# Patient Record
Sex: Female | Born: 1965 | Race: White | Hispanic: No | Marital: Married | Smoking: Never smoker
Health system: Southern US, Community
[De-identification: ages and names within clinical notes are randomized; demographics above are authoritative.]

## PROBLEM LIST (undated history)

## (undated) DIAGNOSIS — IMO0001 Reserved for inherently not codable concepts without codable children: Secondary | ICD-10-CM

## (undated) DIAGNOSIS — J189 Pneumonia, unspecified organism: Secondary | ICD-10-CM

## (undated) DIAGNOSIS — Z9889 Other specified postprocedural states: Secondary | ICD-10-CM

## (undated) DIAGNOSIS — E785 Hyperlipidemia, unspecified: Secondary | ICD-10-CM

## (undated) DIAGNOSIS — C801 Malignant (primary) neoplasm, unspecified: Secondary | ICD-10-CM

## (undated) DIAGNOSIS — M1990 Unspecified osteoarthritis, unspecified site: Secondary | ICD-10-CM

## (undated) DIAGNOSIS — F419 Anxiety disorder, unspecified: Secondary | ICD-10-CM

## (undated) DIAGNOSIS — E1169 Type 2 diabetes mellitus with other specified complication: Secondary | ICD-10-CM

## (undated) DIAGNOSIS — R7303 Prediabetes: Secondary | ICD-10-CM

## (undated) DIAGNOSIS — I10 Essential (primary) hypertension: Secondary | ICD-10-CM

## (undated) DIAGNOSIS — E039 Hypothyroidism, unspecified: Secondary | ICD-10-CM

## (undated) DIAGNOSIS — E669 Obesity, unspecified: Secondary | ICD-10-CM

## (undated) DIAGNOSIS — R112 Nausea with vomiting, unspecified: Secondary | ICD-10-CM

## (undated) HISTORY — PX: WISDOM TOOTH EXTRACTION: SHX21

## (undated) HISTORY — DX: Reserved for inherently not codable concepts without codable children: IMO0001

## (undated) HISTORY — PX: ADENOIDECTOMY: SUR15

## (undated) HISTORY — DX: Malignant (primary) neoplasm, unspecified: C80.1

## (undated) HISTORY — DX: Essential (primary) hypertension: I10

## (undated) HISTORY — DX: Hypothyroidism, unspecified: E03.9

## (undated) HISTORY — DX: Type 2 diabetes mellitus with other specified complication: E11.69

## (undated) HISTORY — DX: Obesity, unspecified: E66.9

## (undated) HISTORY — PX: COLONOSCOPY: SHX174

---

## 1988-04-15 DIAGNOSIS — F4310 Post-traumatic stress disorder, unspecified: Secondary | ICD-10-CM

## 1988-04-15 HISTORY — DX: Post-traumatic stress disorder, unspecified: F43.10

## 1997-07-12 ENCOUNTER — Ambulatory Visit (HOSPITAL_COMMUNITY): Admission: RE | Admit: 1997-07-12 | Discharge: 1997-07-12 | Payer: Self-pay | Admitting: *Deleted

## 1998-09-14 ENCOUNTER — Other Ambulatory Visit: Admission: RE | Admit: 1998-09-14 | Discharge: 1998-09-14 | Payer: Self-pay | Admitting: *Deleted

## 1999-01-24 ENCOUNTER — Encounter (INDEPENDENT_AMBULATORY_CARE_PROVIDER_SITE_OTHER): Payer: Self-pay | Admitting: Specialist

## 1999-01-24 ENCOUNTER — Inpatient Hospital Stay (HOSPITAL_COMMUNITY): Admission: RE | Admit: 1999-01-24 | Discharge: 1999-01-26 | Payer: Self-pay | Admitting: Obstetrics & Gynecology

## 1999-04-16 HISTORY — PX: LAPAROSCOPIC VAGINAL HYSTERECTOMY WITH SALPINGO OOPHORECTOMY: SHX6681

## 1999-11-21 ENCOUNTER — Other Ambulatory Visit: Admission: RE | Admit: 1999-11-21 | Discharge: 1999-11-21 | Payer: Self-pay | Admitting: Obstetrics & Gynecology

## 2000-08-06 ENCOUNTER — Encounter: Admission: RE | Admit: 2000-08-06 | Discharge: 2000-08-22 | Payer: Self-pay | Admitting: Family Medicine

## 2000-12-30 ENCOUNTER — Encounter: Admission: RE | Admit: 2000-12-30 | Discharge: 2001-01-06 | Payer: Self-pay | Admitting: Family Medicine

## 2001-04-23 ENCOUNTER — Other Ambulatory Visit: Admission: RE | Admit: 2001-04-23 | Discharge: 2001-04-23 | Payer: Self-pay | Admitting: Obstetrics & Gynecology

## 2004-06-11 ENCOUNTER — Encounter: Admission: RE | Admit: 2004-06-11 | Discharge: 2004-06-11 | Payer: Self-pay | Admitting: Family Medicine

## 2004-09-05 DIAGNOSIS — D229 Melanocytic nevi, unspecified: Secondary | ICD-10-CM

## 2004-09-05 DIAGNOSIS — C439 Malignant melanoma of skin, unspecified: Secondary | ICD-10-CM

## 2004-09-05 HISTORY — DX: Melanocytic nevi, unspecified: D22.9

## 2004-09-05 HISTORY — DX: Malignant melanoma of skin, unspecified: C43.9

## 2004-09-26 ENCOUNTER — Encounter: Admission: RE | Admit: 2004-09-26 | Discharge: 2004-09-26 | Payer: Self-pay | Admitting: Family Medicine

## 2004-10-04 ENCOUNTER — Encounter: Admission: RE | Admit: 2004-10-04 | Discharge: 2004-10-04 | Payer: Self-pay | Admitting: Family Medicine

## 2007-07-09 ENCOUNTER — Encounter: Admission: RE | Admit: 2007-07-09 | Discharge: 2007-07-09 | Payer: Self-pay | Admitting: Family Medicine

## 2008-12-12 ENCOUNTER — Ambulatory Visit (HOSPITAL_COMMUNITY): Admission: RE | Admit: 2008-12-12 | Discharge: 2008-12-12 | Payer: Self-pay | Admitting: Urology

## 2015-08-08 ENCOUNTER — Ambulatory Visit: Payer: BC Managed Care – PPO | Admitting: Sports Medicine

## 2015-08-28 ENCOUNTER — Ambulatory Visit: Payer: BC Managed Care – PPO | Admitting: Sports Medicine

## 2016-01-14 DIAGNOSIS — E1169 Type 2 diabetes mellitus with other specified complication: Secondary | ICD-10-CM

## 2016-01-14 HISTORY — DX: Type 2 diabetes mellitus with other specified complication: E11.69

## 2016-02-16 ENCOUNTER — Other Ambulatory Visit: Payer: Self-pay | Admitting: Family Medicine

## 2016-02-16 DIAGNOSIS — R7989 Other specified abnormal findings of blood chemistry: Secondary | ICD-10-CM

## 2016-02-16 DIAGNOSIS — R945 Abnormal results of liver function studies: Principal | ICD-10-CM

## 2016-02-23 ENCOUNTER — Encounter: Payer: Self-pay | Admitting: Podiatry

## 2016-02-23 ENCOUNTER — Ambulatory Visit (INDEPENDENT_AMBULATORY_CARE_PROVIDER_SITE_OTHER): Payer: BC Managed Care – PPO | Admitting: Podiatry

## 2016-02-23 VITALS — BP 151/90 | HR 93 | Resp 16 | Ht 63.0 in | Wt 307.0 lb

## 2016-02-23 DIAGNOSIS — L600 Ingrowing nail: Secondary | ICD-10-CM

## 2016-02-23 NOTE — Patient Instructions (Signed)

## 2016-02-23 NOTE — Progress Notes (Signed)
   Subjective:    Patient ID: Megan Brooks, female    DOB: 1966-02-12, 50 y.o.   MRN: [id number]  HPI Chief Complaint  Patient presents with  . Nail Problem    Left foot; great toe; pt stated, "Went to [facility] on Monday, February 19, 2016; had infection and they drained it; on Doxycycline"      Review of Systems  All other systems reviewed and are negative.      Objective:   Physical Exam        Assessment & Plan:

## 2016-02-24 NOTE — Progress Notes (Signed)
Subjective:     Patient ID: Megan Brooks, female   DOB: [birth year], 50 y.o.   MRN: [id number]  HPI patient states I've had chronic problems with my big toenail on my left foot and it became infected over the last few weeks and I been on doxycycline and it seems better but the nail is still very sore and I know I need to have it removed   Review of Systems  All other systems reviewed and are negative.      Objective:   Physical Exam  Constitutional: She is oriented to person, place, and time.  Cardiovascular: Intact distal pulses.   Musculoskeletal: Normal range of motion.  Neurological: She is oriented to person, place, and time.  Skin: Skin is warm.  Nursing note and vitals reviewed.  neurovascular status intact muscle strength adequate range of motion within normal limits with patient noted to have inflammation and pain around the left hallux nail mostly on the lateral side with localized drainage and no proximal edema erythema noted. It is painful when pressed and there is deformity of the nailbed and patient's noted to have good digital perfusion and is well oriented 3     Assessment:     Damaged left hallux nail with acute inflammatory and mild infective process noted being controlled well with antibiotic    Plan:     H&P and education rendered to patient. At this point I do think nail removal is appropriate with probable chemical application if there is no indications of acute infection. I did explain healing will take several weeks there is no long-term guarantees and reviewed risk factors associated with nail removal. Today I infiltrated the left hallux 60 mg like Marcaine mixture removed the left hallux nail exposed the matrix did not note acute drainage at the current time and it appears to be localized and I applied phenol 5 applications 30 seconds followed by alcohol lavage and sterile dressing. Continue antibiotics continue soaks and reappoint to recheck

## 2016-03-04 ENCOUNTER — Other Ambulatory Visit: Payer: Self-pay

## 2016-03-04 ENCOUNTER — Telehealth: Payer: Self-pay | Admitting: *Deleted

## 2016-03-04 NOTE — Telephone Encounter (Signed)
Pt states she had a toenail removed over a 1 week ago and now it is infected. Pt states she saw doctors at urgent care and they cut out the infection and gave Doxycycline, but before she went to urgent care she had called her PCP for an antibiotic and when she went to the pharmacy both prescriptions were there. Pt states the urgent care doxycycline ran out Thursday, and the toe started to look infected Friday, so she started the PCP prescription, and on Saturday the toe looked a little better. Pt states she is using hibiclenz and antibacterial soap soaks alternately and allowing to air dry. I told pt to just use epsom salt soaks 20 minutes 2x day and cover with lightly coated neosporin fabric bandaid for the next 4-6 weeks and if about  4 weeks the area starts to look dryer may switch to epsom salt soaks 1-2 times daily and cover with the neosporin dressing when in shoes or walking around and to allow to air dry when resting. I offered pt an appt and she states with the holiday she would like to take the PCP's rx which will last until Sunday and come in if there is a problem. I told pt that sounded fine and to call with concerns.

## 2016-03-11 ENCOUNTER — Ambulatory Visit
Admission: RE | Admit: 2016-03-11 | Discharge: 2016-03-11 | Disposition: A | Discharge status: Home / Self Care | Payer: BC Managed Care – PPO | Source: Ambulatory Visit | Attending: Family Medicine | Admitting: Family Medicine

## 2016-03-11 DIAGNOSIS — R7989 Other specified abnormal findings of blood chemistry: Secondary | ICD-10-CM

## 2016-03-11 DIAGNOSIS — R945 Abnormal results of liver function studies: Principal | ICD-10-CM

## 2016-03-27 ENCOUNTER — Ambulatory Visit (INDEPENDENT_AMBULATORY_CARE_PROVIDER_SITE_OTHER): Payer: BC Managed Care – PPO | Admitting: Podiatry

## 2016-03-27 ENCOUNTER — Encounter: Payer: Self-pay | Admitting: Podiatry

## 2016-03-27 VITALS — BP 147/90 | HR 94 | Temp 98.3°F | Resp 18

## 2016-03-27 DIAGNOSIS — L03032 Cellulitis of left toe: Secondary | ICD-10-CM

## 2016-03-27 MED ORDER — AMOXICILLIN-POT CLAVULANATE 875-125 MG PO TABS: 1.0000 | ORAL_TABLET | Freq: Two times a day (BID) | ORAL | 0 refills | Status: DC

## 2016-03-27 NOTE — Progress Notes (Signed)
   Subjective:    Patient ID: Megan Brooks, female    DOB: [birth year], 50 y.o.   MRN: [id number]  HPI    This patient presents today concerned about the appearance of her left hallux toenail describing a scab and the general appearance of the left hallux nail bed. She describes a persistent ongoing low-grade erythema and edema in the posterior nail fold area. She initially describes presenting to urgent care on 02/19/2016 for I&D of a drainage in the left hallux toenail with prescription of doxycycline. Patient presented for follow-up for the paronychia ingrowing toenail to Dr. Paulla Dolly on 02/23/2016. At that time apparently a phenol matricectomy was performed. She completed  all doses of doxycycline and is currently not taking any antibiotic. Patient denies history of MRSA.Marland Kitchen Since that time patient describes a localized erythema and edema in the posterior nail fold area that has not changed much since they phenol matricectomy.  Patient is a known diabetic without history of amputation, claudication or ulceration  Review of Systems  HENT: Positive for congestion.   All other systems reviewed and are negative.      Objective:   Physical Exam  BP 147/90 Pulse 94 Respiration 18 Temperature 98.3 Fahrenheit  Orientated 3 No peripheral edema or calf tenderness bilaterally DP and PT pulses 2/4 bilaterally Reflex immediate bilaterally Sensation to 10 g monofilament wire intact 5/5 bilaterally Vibratory sensation reactive bilaterally Ankle reflex equal and reactive bilaterally The left hallux nail L bad has moist crusting appearance. There is a low-grade erythema extends to the posterior nail fold area. There is no ascending erythema, edema, malodor from this site Bunionette's bilaterally      Assessment & Plan:   Assessment: Diabetic with satisfactory neurovascular status Low-grade paronychia or chemical reaction to phenol matricectomy left hallux  Plan: Rx Augmentin 875/125 by  mouth twice a day 10 days Maintain soaks and topical antibiotic ointment Patient instructed if she knows any sudden increase in pain, swelling, redness, fever present to the ED  Reappoint 2 weeks for Dr. Paulla Dolly to follow

## 2016-03-27 NOTE — Patient Instructions (Signed)
Today there was a low-grade chronic swelling at the base of the left great toe after permanent toenail removal. This may be a low-grade infection or a reaction to the chemical applied to the nail root Take the antibiotic prescribed by mouth one twice a day 10 days Apply topical antibiotic ointment and Band-Aid to the left great toe daily If he knows any sudden increase in pain, swelling, redness, fever contact our office or present to urgent care  Diabetes and Foot Care Diabetes may cause you to have problems because of poor blood supply (circulation) to your feet and legs. This may cause the skin on your feet to become thinner, break easier, and heal more slowly. Your skin may become dry, and the skin may peel and crack. You may also have nerve damage in your legs and feet causing decreased feeling in them. You may not notice minor injuries to your feet that could lead to infections or more serious problems. Taking care of your feet is one of the most important things you can do for yourself. Follow these instructions at home:  Wear shoes at all times, even in the house. Do not go barefoot. Bare feet are easily injured.  Check your feet daily for blisters, cuts, and redness. If you cannot see the bottom of your feet, use a mirror or ask someone for help.  Wash your feet with warm water (do not use hot water) and mild soap. Then pat your feet and the areas between your toes until they are completely dry. Do not soak your feet as this can dry your skin.  Apply a moisturizing lotion or petroleum jelly (that does not contain alcohol and is unscented) to the skin on your feet and to dry, brittle toenails. Do not apply lotion between your toes.  Trim your toenails straight across. Do not dig under them or around the cuticle. File the edges of your nails with an emery board or nail file.  Do not cut corns or calluses or try to remove them with medicine.  Wear clean socks or stockings every day. Make  sure they are not too tight. Do not wear knee-high stockings since they may decrease blood flow to your legs.  Wear shoes that fit properly and have enough cushioning. To break in new shoes, wear them for just a few hours a day. This prevents you from injuring your feet. Always look in your shoes before you put them on to be sure there are no objects inside.  Do not cross your legs. This may decrease the blood flow to your feet.  If you find a minor scrape, cut, or break in the skin on your feet, keep it and the skin around it clean and dry. These areas may be cleansed with mild soap and water. Do not cleanse the area with peroxide, alcohol, or iodine.  When you remove an adhesive bandage, be sure not to damage the skin around it.  If you have a wound, look at it several times a day to make sure it is healing.  Do not use heating pads or hot water bottles. They may burn your skin. If you have lost feeling in your feet or legs, you may not know it is happening until it is too late.  Make sure your health care provider performs a complete foot exam at least annually or more often if you have foot problems. Report any cuts, sores, or bruises to your health care provider immediately. Contact a health  care provider if:  You have an injury that is not healing.  You have cuts or breaks in the skin.  You have an ingrown nail.  You notice redness on your legs or feet.  You feel burning or tingling in your legs or feet.  You have pain or cramps in your legs and feet.  Your legs or feet are numb.  Your feet always feel cold. Get help right away if:  There is increasing redness, swelling, or pain in or around a wound.  There is a red line that goes up your leg.  Pus is coming from a wound.  You develop a fever or as directed by your health care provider.  You notice a bad smell coming from an ulcer or wound. This information is not intended to replace advice given to you by your health  care provider. Make sure you discuss any questions you have with your health care provider. Document Released: 03/29/2000 Document Revised: 09/07/2015 Document Reviewed: 09/08/2012 Elsevier Interactive Patient Education  2017 [employer].

## 2016-04-12 ENCOUNTER — Ambulatory Visit: Payer: BC Managed Care – PPO | Admitting: Podiatry

## 2016-04-24 ENCOUNTER — Ambulatory Visit (INDEPENDENT_AMBULATORY_CARE_PROVIDER_SITE_OTHER): Payer: BC Managed Care – PPO

## 2016-04-24 ENCOUNTER — Encounter: Payer: Self-pay | Admitting: Podiatry

## 2016-04-24 ENCOUNTER — Ambulatory Visit (INDEPENDENT_AMBULATORY_CARE_PROVIDER_SITE_OTHER): Payer: BC Managed Care – PPO | Admitting: Podiatry

## 2016-04-24 VITALS — BP 130/82 | HR 91 | Resp 16

## 2016-04-24 DIAGNOSIS — M79675 Pain in left toe(s): Secondary | ICD-10-CM | POA: Diagnosis not present

## 2016-04-24 DIAGNOSIS — L03032 Cellulitis of left toe: Secondary | ICD-10-CM | POA: Diagnosis not present

## 2016-04-25 NOTE — Progress Notes (Signed)
Subjective:     Patient ID: Megan Brooks, female   DOB: 01/21/1966, 51 y.o.   MRN: [id number]  HPI patient states that she still concerned because she has some redness in the big toe left with drainage that's nonpainful. States that her sugars been running well and no issues there and there's been no proximal edema erythema or drainage noted when she checks   Review of Systems     Objective:   Physical Exam Neurovascular status found to be intact with a small area on the lateral side of the left hallux that has some small redness to it that's localized with no proximal edema erythema drainage noted there is no odor occurring currently and she has been on antibiotics for 3 rounds and is not getting worse at all that she's just concerned because there still redness    Assessment:     Possibility for mild paronychia infection secondary to the severe ingrown toenail deformity she had had previously or possibility for distal localized irritative process    Plan:     H&P x-rays reviewed with patient. Today I went ahead and I infiltrated the left hallux 60 mg Xylocaine Marcaine mixture and I removed some abscess tissue on the lateral side and was not able to get any pus formation I did as precautionary measure swab the area with a culture and I'm going to send the sent for culture and sensitivity. I then did x-ray to confirm no change in bone and I advised her on soaking this after a clean the tissue out and hopefully this will resolve any remaining issue she has. Given strict instructions of any increased redness drainage or any proximal signs of infection were to occur she is to call us immediately and go to the emergency room  X-rays were negative for signs of bone infection lysis at the current time

## 2016-04-28 LAB — WOUND CULTURE
Gram Stain: NONE SEEN
Gram Stain: NONE SEEN

## 2016-05-03 ENCOUNTER — Telehealth: Payer: Self-pay | Admitting: *Deleted

## 2016-05-03 DIAGNOSIS — L03032 Cellulitis of left toe: Secondary | ICD-10-CM

## 2016-05-03 MED ORDER — DOXYCYCLINE HYCLATE 100 MG PO CAPS: 100.0000 mg | ORAL_CAPSULE | Freq: Two times a day (BID) | ORAL | 0 refills | Status: DC

## 2016-05-03 NOTE — Telephone Encounter (Addendum)
Pt states she reviewed the labs and wound culture and would like to know if it is MRSA, it looks like it is to her, and she would like to know how Dr. Paulla Dolly will treat. Dr. Paulla Dolly reviewed the Results and ordered Doxycycline 100mg  one bid #28. Informed pt of Dr. Paulla Dolly orders and told her to continue the soaks and the neosporin dressing. Pt states it looks some better but is red. 05/16/2016-Dr. Regal okayed referral to Infectious Disease Dr. Michel Bickers. 05/17/2016-DrPaulla Dolly okayed pt's request to be referred to Dr. Michel Bickers - Infectious Disease. Informed pt of the referral.

## 2016-05-15 NOTE — Telephone Encounter (Signed)
Pt called asking since she is almost done with her antibiotics and the toe is still infected if you could refer her to Dr Michel Bickers infectious disease with [facility]. She is very happy with Dr Paulla Dolly but wanted to see a mrsa specialists. Can you call pt and let her know.

## 2016-05-16 NOTE — Telephone Encounter (Signed)
That's fine. Not sure what he is going to be able to do

## 2016-05-29 ENCOUNTER — Encounter: Payer: Self-pay | Admitting: Infectious Diseases

## 2016-05-29 ENCOUNTER — Ambulatory Visit (INDEPENDENT_AMBULATORY_CARE_PROVIDER_SITE_OTHER): Payer: BC Managed Care – PPO | Admitting: Infectious Diseases

## 2016-05-29 DIAGNOSIS — E11621 Type 2 diabetes mellitus with foot ulcer: Secondary | ICD-10-CM | POA: Insufficient documentation

## 2016-05-29 DIAGNOSIS — E669 Obesity, unspecified: Secondary | ICD-10-CM | POA: Insufficient documentation

## 2016-05-29 DIAGNOSIS — E11628 Type 2 diabetes mellitus with other skin complications: Secondary | ICD-10-CM

## 2016-05-29 DIAGNOSIS — IMO0001 Reserved for inherently not codable concepts without codable children: Secondary | ICD-10-CM

## 2016-05-29 DIAGNOSIS — L089 Local infection of the skin and subcutaneous tissue, unspecified: Secondary | ICD-10-CM

## 2016-05-29 DIAGNOSIS — Z6841 Body Mass Index (BMI) 40.0 and over, adult: Secondary | ICD-10-CM

## 2016-05-29 DIAGNOSIS — L97509 Non-pressure chronic ulcer of other part of unspecified foot with unspecified severity: Secondary | ICD-10-CM

## 2016-05-29 NOTE — Progress Notes (Signed)
   Subjective:    Patient ID: YSA EVOY, female    DOB: [birth year], 51 y.o.   MRN: [id number]  HPI "I had MRSA on my toe" 51 yo F with hx of L great toe nail that was infected.  She was in [place] in 02-19-16. She was noted to have a pus-pocket, swollen red, hurting, thronibing. Was started on doxy.   Was re-opened/toe nail removed in December to allow further drainage. She got 2nd round of doxy, used epsom soaks.  By 1-10 it was still infected- very erythematous. Swollen. Pain resolved. She got another round of doxy (14 days). She quit soaking her toe in epsom salts.  She has persistent redness of her toe.  She completed 3 rounds of doxy, 2 rounds of augmentin.  Off anbx for last 1.5 weeks.   Last A1C 7.1%  The past medical history, family history and social history were reviewed/updated in EPIC   Review of Systems  Constitutional: Negative for appetite change, chills, fever and unexpected weight change.  Gastrointestinal: Negative for constipation and diarrhea.  Genitourinary: Negative for difficulty urinating and menstrual problem.  Skin: Positive for wound.  Neurological: Negative for numbness.   Please see HPI. 12 point ROS o/w (-)  optho eval in last 12 months.  Has had 3 colonoscopies. Due for next.     Objective:   Physical Exam  Constitutional: She appears well-developed and well-nourished.  HENT:  Mouth/Throat: No oropharyngeal exudate.  Eyes: EOM are normal. Pupils are equal, round, and reactive to light.  Neck: Neck supple.  Cardiovascular: Normal rate, regular rhythm and normal heart sounds.   Pulmonary/Chest: Effort normal and breath sounds normal.  Abdominal: Soft. Bowel sounds are normal. There is no tenderness. There is no rebound.  Musculoskeletal:       Feet:  Lymphadenopathy:    She has no cervical adenopathy.      Assessment & Plan:

## 2016-05-29 NOTE — Assessment & Plan Note (Signed)
I gave my opinions to the pt and her husband-  Her wound is well healed. It is not hot. The erythema is mild. Her toe does not appear infected.  I explained choices to her and her husband: Accept that her toe is improved.  Consider checking inflammatory markers (ESR, CRP) and the inherent limitations of these tests.  Consider checking a MRI.   We discussed this at length. We discussed the risk of treating infection (including osteo of her toe) sooner rather than later.  She will "pray" about this and call me back with her choice.

## 2016-06-18 ENCOUNTER — Telehealth: Payer: Self-pay | Admitting: *Deleted

## 2016-06-18 NOTE — Telephone Encounter (Signed)
Patient called and advised she has had time to reconsider and wants to have the MRI foot they spoke of at her last visit. Advised the toe is redder than at the visit and she has been off of antibiotics for a month now and feels she needs the MRI. Advised her will ask the doctor and if he still feels it is necessary will have him place referral and give her a call back.

## 2016-06-19 ENCOUNTER — Other Ambulatory Visit: Payer: Self-pay | Admitting: Infectious Diseases

## 2016-06-19 DIAGNOSIS — L089 Local infection of the skin and subcutaneous tissue, unspecified: Principal | ICD-10-CM

## 2016-06-19 DIAGNOSIS — E11628 Type 2 diabetes mellitus with other skin complications: Secondary | ICD-10-CM

## 2016-06-19 NOTE — Telephone Encounter (Signed)
Orders placed She needs labs prior If Cr is normal, proccede with [facility] thanks

## 2016-06-19 NOTE — Telephone Encounter (Signed)
Informed patient of Johnnye Sima decision and she will come for labs 06/20/16 at 1030. After resulted will schedule MRI.

## 2016-06-20 ENCOUNTER — Other Ambulatory Visit: Payer: BC Managed Care – PPO

## 2016-06-20 DIAGNOSIS — L089 Local infection of the skin and subcutaneous tissue, unspecified: Principal | ICD-10-CM

## 2016-06-20 DIAGNOSIS — E11628 Type 2 diabetes mellitus with other skin complications: Secondary | ICD-10-CM

## 2016-06-20 LAB — BASIC METABOLIC PANEL
BUN: 9 mg/dL (ref 7–25)
CHLORIDE: 100 mmol/L (ref 98–110)
CO2: 28 mmol/L (ref 20–31)
CREATININE: 0.8 mg/dL (ref 0.50–1.05)
Calcium: 9.2 mg/dL (ref 8.6–10.4)
Glucose, Bld: 151 mg/dL — ABNORMAL HIGH (ref 65–99)
Potassium: 3.9 mmol/L (ref 3.5–5.3)
Sodium: 140 mmol/L (ref 135–146)

## 2016-06-21 LAB — C-REACTIVE PROTEIN: CRP: 10.7 mg/L — AB (ref <8.0)

## 2016-06-21 LAB — SEDIMENTATION RATE: Sed Rate: 26 mm/hr (ref 0–30)

## 2016-06-26 ENCOUNTER — Other Ambulatory Visit: Payer: Self-pay | Admitting: *Deleted

## 2016-06-26 DIAGNOSIS — E11621 Type 2 diabetes mellitus with foot ulcer: Secondary | ICD-10-CM

## 2016-06-26 DIAGNOSIS — L97529 Non-pressure chronic ulcer of other part of left foot with unspecified severity: Principal | ICD-10-CM

## 2016-06-27 ENCOUNTER — Other Ambulatory Visit: Payer: Self-pay | Admitting: Infectious Diseases

## 2016-06-27 DIAGNOSIS — E11621 Type 2 diabetes mellitus with foot ulcer: Secondary | ICD-10-CM

## 2016-06-27 DIAGNOSIS — L97529 Non-pressure chronic ulcer of other part of left foot with unspecified severity: Principal | ICD-10-CM

## 2016-06-27 NOTE — Progress Notes (Signed)
Looks like you put this in for me Thanks jeff

## 2016-06-27 NOTE — Progress Notes (Signed)
[facility] needed to with and without. Thanks for the cosign.

## 2016-07-04 ENCOUNTER — Telehealth: Payer: Self-pay | Admitting: *Deleted

## 2016-07-04 NOTE — Telephone Encounter (Signed)
[employer] denied the MRI as not meeting the criteria but it is eligible for peer review if you call [employer] at [phone]. Please let me know what you would like to do. Patient is scheduled for 07/08/16.  They will need the patient Member number [id number] [facility]

## 2016-07-08 ENCOUNTER — Other Ambulatory Visit: Payer: BC Managed Care – PPO

## 2016-07-08 ENCOUNTER — Telehealth: Payer: Self-pay | Admitting: Infectious Diseases

## 2016-07-08 NOTE — Telephone Encounter (Signed)
Called pt,  Cancel MRI thnaks

## 2016-07-08 NOTE — Telephone Encounter (Signed)
Called pt- she still has some redness in her foot.  It has not gotten any worse per pt. She had a 2 day period of worsened rubor, but this resolved.  Her insurance balked at her MRI. She will f/u with Dr Paulla Dolly to have repeat plain film of her foot.  She will rtc prn.

## 2016-07-25 ENCOUNTER — Ambulatory Visit (INDEPENDENT_AMBULATORY_CARE_PROVIDER_SITE_OTHER): Payer: Self-pay

## 2016-07-25 ENCOUNTER — Ambulatory Visit (INDEPENDENT_AMBULATORY_CARE_PROVIDER_SITE_OTHER): Payer: BC Managed Care – PPO

## 2016-07-25 DIAGNOSIS — L03032 Cellulitis of left toe: Secondary | ICD-10-CM | POA: Diagnosis not present

## 2016-08-27 ENCOUNTER — Other Ambulatory Visit: Payer: Self-pay | Admitting: Family Medicine

## 2016-08-27 DIAGNOSIS — R0989 Other specified symptoms and signs involving the circulatory and respiratory systems: Secondary | ICD-10-CM

## 2016-08-29 ENCOUNTER — Telehealth: Payer: Self-pay | Admitting: *Deleted

## 2016-08-29 NOTE — Telephone Encounter (Signed)
Pt states her x-rays were to be sent to Dr. Johnnye Sima to evaluation absence of osteomyelitis.

## 2016-08-29 NOTE — Telephone Encounter (Signed)
Patient called to get the results of her xray from 07/25/16 and make sure everything is ok with her foot. Advised will have the doctor review and give her a call once he does.

## 2016-08-30 NOTE — Telephone Encounter (Signed)
Called patient to let her know that since Dr. Johnnye Sima is part of [place] he would be able to view her x-rays in her chart. Told patient to call us with any other questions.

## 2016-09-03 NOTE — Telephone Encounter (Signed)
X-ray not done in [facility] system/epic, I cannot see results.  thanks

## 2016-09-04 NOTE — Telephone Encounter (Signed)
Patient was told we could see he xray results in the system. We are not able to see them they refer you to the office note of which there are no notes as well. Called the office and asked them to fax the report to [place]. Had to leave a message as we placed in voice mail. Will call the patient and let her know what is going on as well.

## 2016-09-05 NOTE — Telephone Encounter (Signed)
RN returned call to Megan Brooks, asking for update on when the xray report from 4/12 may be available in EPIC. Landis Gandy, RN

## 2016-10-07 ENCOUNTER — Ambulatory Visit
Admission: RE | Admit: 2016-10-07 | Discharge: 2016-10-07 | Disposition: A | Discharge status: Home / Self Care | Payer: BC Managed Care – PPO | Source: Ambulatory Visit | Attending: Family Medicine | Admitting: Family Medicine

## 2016-10-07 DIAGNOSIS — R0989 Other specified symptoms and signs involving the circulatory and respiratory systems: Secondary | ICD-10-CM

## 2017-04-11 ENCOUNTER — Encounter: Payer: Self-pay | Admitting: Podiatry

## 2017-04-11 ENCOUNTER — Ambulatory Visit (INDEPENDENT_AMBULATORY_CARE_PROVIDER_SITE_OTHER): Payer: BC Managed Care – PPO

## 2017-04-11 ENCOUNTER — Ambulatory Visit: Payer: BC Managed Care – PPO | Admitting: Podiatry

## 2017-04-11 DIAGNOSIS — S92405A Nondisplaced unspecified fracture of left great toe, initial encounter for closed fracture: Secondary | ICD-10-CM | POA: Diagnosis not present

## 2017-04-11 DIAGNOSIS — S92345A Nondisplaced fracture of fourth metatarsal bone, left foot, initial encounter for closed fracture: Secondary | ICD-10-CM | POA: Diagnosis not present

## 2017-04-11 DIAGNOSIS — M79672 Pain in left foot: Secondary | ICD-10-CM | POA: Diagnosis not present

## 2017-04-11 NOTE — Progress Notes (Signed)
This patient presents the office with chief complaint of a painful left foot.  She says she believed she injured  upon getting into her vehicle.  She says that she did not feel any immediate pain, but the pain started later that day.  She says that her foot had become painful and sore on the top of her left foot.  She says that she has self treated with ice and Advil but the pain appears to be worsening.  Therefore, she made an appointment for an evaluation of her l foot.   General Appearance  Alert, conversant and in no acute stress.  Vascular  Dorsalis pedis and posterior pulses are palpable  bilaterally.  Capillary return is within normal limits  bilaterally. Temperature is within normal limits  Bilaterally.  Neurologic  Senn-Weinstein monofilament wire test within normal limits  bilaterally. Muscle power within normal limits bilaterally.  Nails Normal nails noted with no evidence of fungal or bacterial infection..  Orthopedic  No limitations of motion of motion feet bilaterally.  No crepitus or effusions noted.  No bony pathology or digital deformities noted.  This .patient had a hot  inflamed area at the insertion of the Achilles tendon, right foot.  Patient has a markedly swollen dorsal aspect left foot above the level of the fourth metatarsal shaft  Skin  normotropic skin with no porokeratosis noted bilaterally.  No signs of infections or ulcers noted.   Closed metatarsal fracture left foot.   ROV  X-rays taken today reveal a break through the cortical bone on the lateral shaft of the fourth metatarsal left foot.  No evidence of any displacement noted.  We discussed her condition and decided to treat her with a compression sock to help to reduc the swelling and a surgical shoe for her to wear for the next 4 weeks while healing of the fourth metatarsal fracture occurs.  I told her that if the problem worsens, we can always put her into an Haematologist and a Cam Walker.  RTC 4 weeks.   Gardiner Barefoot DPM

## 2017-04-18 ENCOUNTER — Other Ambulatory Visit: Payer: Self-pay | Admitting: Podiatry

## 2017-04-18 DIAGNOSIS — S92405A Nondisplaced unspecified fracture of left great toe, initial encounter for closed fracture: Secondary | ICD-10-CM

## 2017-05-14 ENCOUNTER — Encounter: Payer: Self-pay | Admitting: Podiatry

## 2017-05-14 ENCOUNTER — Ambulatory Visit: Payer: BC Managed Care – PPO | Admitting: Podiatry

## 2017-05-14 ENCOUNTER — Ambulatory Visit (INDEPENDENT_AMBULATORY_CARE_PROVIDER_SITE_OTHER): Payer: BC Managed Care – PPO

## 2017-05-14 DIAGNOSIS — E119 Type 2 diabetes mellitus without complications: Secondary | ICD-10-CM | POA: Diagnosis not present

## 2017-05-14 DIAGNOSIS — S92345A Nondisplaced fracture of fourth metatarsal bone, left foot, initial encounter for closed fracture: Secondary | ICD-10-CM

## 2017-05-14 NOTE — Progress Notes (Signed)
This patient presents the office following a diagnosis of a closed metatarsal fracture fourth metatarsal left foot.  She has been using a compression sock and wearing a surgical shoe since her initial exam.  She says that her foot has healed but she occasionally missteps, which causes sharp, radiating pain noted through the outside of her left foot.  She has no evidence of any swelling with minimal pain, left foot.  She returns the office today for follow-up exam with follow-up x-rays.  General Appearance  Alert, conversant and in no acute stress.  Vascular  Dorsalis pedis and posterior pulses are palpable  bilaterally.  Capillary return is within normal limits  bilaterally. Temperature is within normal limits  Bilaterally.  Neurologic  Senn-Weinstein monofilament wire test within normal limits  bilaterally. Muscle power within normal limits bilaterally.  Nails Normal nails noted with no evidence of fungal or bacterial infection.  Orthopedic  No limitations of motion of motion feet bilaterally.  No crepitus or effusions noted. No palpable pain noted in fourth metatarsal left foot.  Minimal swelling noted.    Skin  normotropic skin with no porokeratosis noted bilaterally.  No signs of infections or ulcers noted.    ROV  X-rays reveal no evidence of any bony pathology or even bone callus fourth metatarsal left foot.  X-rays do reveal a fracture at the fifth digit, left foot.  Discussed her clinical and x-ray findings with this patient.  Told her she could return to her regular footgear, which will help to relieve the peroneal tendinitis that has developed guarding her  left forefoot.  If pain returns, she should return to her surgical shoe.  RTC prn.   Gardiner Barefoot DPM

## 2017-10-02 ENCOUNTER — Telehealth: Payer: Self-pay | Admitting: Podiatry

## 2017-10-02 NOTE — Telephone Encounter (Signed)
I'm a pt of Dr. Mellody Drown but I was seen by Dr. Prudence Davidson earlier this year for a broken foot. I have an appointment next Wednesday with an orthopaedic doctor and he is requesting I bring a copy of my x-rays on a disc and the records. I need to know how do I go about obtaining those records. You can reach me at [phone]. Thanks so much. Bye bye.

## 2017-10-03 ENCOUNTER — Telehealth: Payer: Self-pay | Admitting: Podiatry

## 2017-10-03 NOTE — Telephone Encounter (Signed)
I called the pt back in regards to her request for her office visit notes and x-rays on a disc. I told the pt she would need to fill out and sign a medical records release form before we could release that information to her. Pt asked if she could come by on Monday, 24 June around 4:30 pm to fill out and sign the form and pick up her records then. I told her that was find and that I would go ahead and work on it for her and place it up front so all she had to do was walk to the front desk and they would give her the form.

## 2017-10-06 ENCOUNTER — Telehealth: Payer: Self-pay | Admitting: Podiatry

## 2017-10-06 NOTE — Telephone Encounter (Signed)
I spoke to you last week about coming today around 4:30 pm to pick up my records where I saw Dr. Prudence Davidson. I was wondering if you could e-mail me the release form to fill out and sign so that my husband could come by and pick up my records today. My e-mail is [email or web address] and my phone number is [phone]. Thanks so much. Bye bye.

## 2017-10-06 NOTE — Telephone Encounter (Signed)
I called the pt to let her know that I had e-mailed her the requested medical records release form for her to fill out and sign to authorize [place] to release her records to her husband who will come by and pick them up. Pt confirmed she will fax the signed form back to me at [phone].

## 2018-02-24 ENCOUNTER — Encounter: Payer: Self-pay | Admitting: Registered"

## 2018-02-24 ENCOUNTER — Encounter: Payer: BC Managed Care – PPO | Attending: Family Medicine | Admitting: Registered"

## 2018-02-24 DIAGNOSIS — E119 Type 2 diabetes mellitus without complications: Secondary | ICD-10-CM

## 2018-02-24 DIAGNOSIS — E1139 Type 2 diabetes mellitus with other diabetic ophthalmic complication: Secondary | ICD-10-CM | POA: Insufficient documentation

## 2018-02-24 NOTE — Progress Notes (Signed)
Diabetes Self-Management Education  Visit Type:  First/Initial  Appt. Start Time: 4:00 Appt. End Time: 5:17  02/25/2018  [name], identified by name and date of birth, is a 52 y.o. female with a diagnosis of Diabetes: Type 2.   ASSESSMENT  Pt expectations: none stated  Pt states she is in denial about having diabetes and being morbidly obese. Pt states she stopped taking metformin before because it upset her stomach. Pt states her A1c was 5.9 and then increased to 7.3. Pt states she had increased stress during 4 years period of time: mom passed away, brother attempted suicide, and children going away to college. Pt states she does not like to eat at home because her children have moved out. Pt states she has to make a conscious decision eat at home; prefers to eat out with husband.   Pt states she does not like breakfast. Pt states she is more of a sweet person rather chips. Pt states she does not prefer fruit; only bananas and cantaloupe. Pt states she has stopped drinking sweet tea and sodas since diabetes diagnosis. Pt reports both parents were overweight and she has history of dieting. Pt states she does not want to monitor what she eats.   Next visit - physical activity, chronic complications, support group, etc  There were no vitals taken for this visit. There is no height or weight on file to calculate BMI.   Diabetes Self-Management Education - 02/24/18 1613      Health Coping   How would you rate your overall health?  Good      Psychosocial Assessment   Patient Belief/Attitude about Diabetes  Motivated to manage diabetes   Pt states she is motivated but also in denial about having diabetes   Self-care barriers  None    Self-management support  None    Patient Concerns  Nutrition/Meal planning    Special Needs  None    Preferred Learning Style  No preference indicated    Learning Readiness  Ready      Complications   Last HgB A1C per patient/outside source  8 %     How often do you check your blood sugar?  0 times/day (not testing)    Number of hypoglycemic episodes per month  0    Number of hyperglycemic episodes per week  0    Have you had a dilated eye exam in the past 12 months?  Yes    Have you had a dental exam in the past 12 months?  Yes    Are you checking your feet?  No      Dietary Intake   Breakfast  skips; banana or cereal or bacon + eggs+ hashbrowns + toast + grape jelly    Snack (morning)  none    Lunch  peanut butter sandwich or leftovers     Snack (afternoon)  nuts or candy bar or oreo's    Dinner  [employer]-roast beef sandwich + potato cakes or homemade chili + cheese + crackers    Snack (evening)  sometimes ice cream or chips or nuts or cookies + milk    Beverage(s)  skim or 1% milk, unsweetened tea, water      Exercise   Exercise Type  ADL's    How many days per week to you exercise?  0    How many minutes per day do you exercise?  0    Total minutes per week of exercise  0  Patient Education   Previous Diabetes Education  No    Disease state   Definition of diabetes, type 1 and 2, and the diagnosis of diabetes;Factors that contribute to the development of diabetes    Nutrition management   Role of diet in the treatment of diabetes and the relationship between the three main macronutrients and blood glucose level;Reviewed blood glucose goals for pre and post meals and how to evaluate the patients' food intake on their blood glucose level.;Information on hints to eating out and maintain blood glucose control.    Monitoring  Purpose and frequency of SMBG.;Interpreting lab values - A1C, lipid, urine microalbumina.;Identified appropriate SMBG and/or A1C goals.;Taught/discussed recording of test results and interpretation of SMBG.    Acute complications  Taught treatment of hypoglycemia - the 15 rule.;Discussed and identified patients' treatment of hyperglycemia.    Chronic complications  Reviewed with patient heart disease,  higher risk of, and prevention;Lipid levels, blood glucose control and heart disease    Psychosocial adjustment  Role of stress on diabetes      Individualized Goals (developed by patient)   Nutrition  Follow meal plan discussed;General guidelines for healthy choices and portions discussed    Physical Activity  Not Applicable    Monitoring   test my blood glucose as discussed    Reducing Risk  examine blood glucose patterns;treat hypoglycemia with 15 grams of carbs if blood glucose less than 70mg /dL;increase portions of healthy fats;increase portions of nuts and seeds;increase portions of olive oil in diet    Health Coping  Not Applicable      Post-Education Assessment   Patient understands the diabetes disease and treatment process.  Demonstrates understanding / competency    Patient understands incorporating nutritional management into lifestyle.  Demonstrates understanding / competency    Patient undertands incorporating physical activity into lifestyle.  Needs Review    Patient understands using medications safely.  Needs Review    Patient understands monitoring blood glucose, interpreting and using results  Demonstrates understanding / competency    Patient understands prevention, detection, and treatment of acute complications.  Demonstrates understanding / competency    Patient understands prevention, detection, and treatment of chronic complications.  Needs Review    Patient understands how to develop strategies to address psychosocial issues.  Needs Review    Patient understands how to develop strategies to promote health/change behavior.  Needs Review      Outcomes   Program Status  Not Completed       Learning Objective:  Patient will have a greater understanding of diabetes self-management. Patient education plan is to attend individual and/or group sessions per assessed needs and concerns.   Plan:   Patient Instructions       Expected Outcomes:  Demonstrated  interest in learning. Expect positive outcomes  Education material provided: ADA Diabetes: Your Take Control Guide  If problems or questions, patient to contact team via:  Phone and Email  Future DSME appointment: - 2 wks

## 2018-03-19 ENCOUNTER — Ambulatory Visit: Payer: BC Managed Care – PPO | Admitting: Registered"

## 2018-04-22 ENCOUNTER — Encounter: Payer: Self-pay | Admitting: Podiatry

## 2018-04-22 ENCOUNTER — Other Ambulatory Visit: Payer: Self-pay | Admitting: Podiatry

## 2018-04-22 ENCOUNTER — Ambulatory Visit: Payer: BC Managed Care – PPO | Admitting: Podiatry

## 2018-04-22 ENCOUNTER — Ambulatory Visit (INDEPENDENT_AMBULATORY_CARE_PROVIDER_SITE_OTHER): Payer: BC Managed Care – PPO

## 2018-04-22 DIAGNOSIS — M7752 Other enthesopathy of left foot: Secondary | ICD-10-CM | POA: Diagnosis not present

## 2018-04-22 DIAGNOSIS — M779 Enthesopathy, unspecified: Secondary | ICD-10-CM

## 2018-04-22 DIAGNOSIS — M79672 Pain in left foot: Secondary | ICD-10-CM

## 2018-04-22 DIAGNOSIS — S92345A Nondisplaced fracture of fourth metatarsal bone, left foot, initial encounter for closed fracture: Secondary | ICD-10-CM

## 2018-04-22 MED ORDER — TRIAMCINOLONE ACETONIDE 10 MG/ML IJ SUSP
10.0000 mg | Freq: Once | INTRAMUSCULAR | Status: AC
  Administered 2018-04-22: 10 mg

## 2018-04-22 NOTE — Progress Notes (Signed)
Subjective:   Patient ID: Megan Brooks, female   DOB: 53 y.o.   MRN: [id number]   HPI Patient states she is had problems for a year with the top of her left foot and states that she thought that she had a fracture at that time but is remained tender despite only wearing tennis shoes   ROS      Objective:  Physical Exam  Neurovascular status intact with inflammation of the fifth MPJ left localized in nature with no pain of the fourth MPJ or interspace discomfort     Assessment:  Possibility of capsulitis fifth MPJ left chronic in nature with probability or possibility of fracture of the lesser mats from last     Plan:  H&P x-rays reviewed and today I am get a try a very simple injection of the dorsal capsule left 3 mg Dexasone Kenalog 5 g Xylocaine after sterile prep with sterile dressing applied and if symptoms do not improve we will have to consider MRI to understand why she continues to have this persistent pain.  Reappoint 3 weeks to reevaluate  X-ray indicates that there is slight haziness around the fourth metatarsal left but I did not note any disruption of the cortical pattern fourth or fifth metatarsal left

## 2018-05-07 ENCOUNTER — Ambulatory Visit: Payer: BC Managed Care – PPO | Admitting: Registered"

## 2018-05-13 ENCOUNTER — Encounter: Payer: Self-pay | Admitting: Podiatry

## 2018-05-13 ENCOUNTER — Ambulatory Visit (INDEPENDENT_AMBULATORY_CARE_PROVIDER_SITE_OTHER): Payer: BC Managed Care – PPO | Admitting: Podiatry

## 2018-05-13 DIAGNOSIS — M779 Enthesopathy, unspecified: Secondary | ICD-10-CM | POA: Diagnosis not present

## 2018-05-13 DIAGNOSIS — M79672 Pain in left foot: Secondary | ICD-10-CM

## 2018-05-13 MED ORDER — TRIAMCINOLONE ACETONIDE 10 MG/ML IJ SUSP
10.0000 mg | Freq: Once | INTRAMUSCULAR | Status: AC
  Administered 2018-05-13: 10 mg

## 2018-05-14 NOTE — Progress Notes (Signed)
Subjective:   Patient ID: Megan Brooks, female   DOB: 53 y.o.   MRN: [id number]   HPI Patient states her pain has reduced quite a bit and she still having residual pain but feels improvement from where it was previously   ROS      Objective:  Physical Exam  Neurovascular status intact with inflammation occurring around the fourth MPJ and fifth MPJ but localized with no other pathology noted     Assessment:  Inflammatory condition fifth MPJ fourth interspace which seems to be responding to conservative treatment     Plan:  Reviewed this condition with her and I am hopeful that we will get this better and I went ahead and I did do sterile prep of the area I reinjected with 3 mg Dexasone Kenalog 5 g Xylocaine around the fifth MPJ fourth interspace and hopefully this will be the other symptoms with patient utilizing rigid bottom shoes and not going barefoot.  Reappoint as needed

## 2019-10-20 ENCOUNTER — Encounter: Payer: Self-pay | Admitting: *Deleted

## 2019-10-29 ENCOUNTER — Ambulatory Visit: Payer: BC Managed Care – PPO | Admitting: Physician Assistant

## 2019-10-29 ENCOUNTER — Other Ambulatory Visit: Payer: Self-pay

## 2019-10-29 ENCOUNTER — Encounter: Payer: Self-pay | Admitting: Physician Assistant

## 2019-10-29 DIAGNOSIS — B078 Other viral warts: Secondary | ICD-10-CM

## 2019-10-29 DIAGNOSIS — L280 Lichen simplex chronicus: Secondary | ICD-10-CM | POA: Diagnosis not present

## 2019-10-29 DIAGNOSIS — Z1283 Encounter for screening for malignant neoplasm of skin: Secondary | ICD-10-CM | POA: Diagnosis not present

## 2019-10-29 DIAGNOSIS — Z85820 Personal history of malignant melanoma of skin: Secondary | ICD-10-CM | POA: Diagnosis not present

## 2019-10-29 MED ORDER — TRIAMCINOLONE ACETONIDE 0.1 % EX CREA: 1.0000 "application " | TOPICAL_CREAM | Freq: Every day | CUTANEOUS | 0 refills | Status: AC | PRN

## 2019-10-29 NOTE — Progress Notes (Signed)
   Follow-Up Visit   Subjective  Megan Brooks is a 54 y.o. female who presents for the following: Other (Spots face and in front of left ear that are scaly. Skin behind left ear is very itchy).   The following portions of the chart were reviewed this encounter and updated as appropriate: Allergies  Meds      Objective  Well appearing patient in no apparent distress; mood and affect are within normal limits.  All skin waist up examined.  Objective  waist up: No atypical nevi No signs of non-mole skin cancer.   Objective  Head - Anterior (Face) (4): Verrucous papules -- Discussed viral etiology and contagion.   Assessment & Plan  Personal history of malignant melanoma of skin Neck - Anterior  Screening exam for skin cancer waist up  Skin examinations  Other viral warts (4) Head - Anterior (Face)  Destruction of lesion - Head - Anterior (Face) Complexity: simple   Destruction method: cryotherapy   Informed consent: discussed and consent obtained   Timeout:  patient name, date of birth, surgical site, and procedure verified Lesion destroyed using liquid nitrogen: Yes   Cryotherapy cycles:  1 Outcome: patient tolerated procedure well with no complications   Post-procedure details: wound care instructions given    Lichen simplex chronicus Left Postauricular Sulcus  Ordered Medications: triamcinolone cream (KENALOG) 0.1 %   I, Mychael Soots, PA-C, have reviewed all documentation's for this visit.  The documentation on 10/29/19 for the exam, diagnosis, procedures and orders are all accurate and complete.

## 2019-11-14 ENCOUNTER — Other Ambulatory Visit: Payer: Self-pay

## 2019-11-14 ENCOUNTER — Emergency Department (HOSPITAL_BASED_OUTPATIENT_CLINIC_OR_DEPARTMENT_OTHER): Payer: BC Managed Care – PPO

## 2019-11-14 ENCOUNTER — Encounter (HOSPITAL_BASED_OUTPATIENT_CLINIC_OR_DEPARTMENT_OTHER): Payer: Self-pay | Admitting: Emergency Medicine

## 2019-11-14 ENCOUNTER — Emergency Department (HOSPITAL_BASED_OUTPATIENT_CLINIC_OR_DEPARTMENT_OTHER)
Admission: EM | Admit: 2019-11-14 | Discharge: 2019-11-14 | Disposition: A | Discharge status: Home / Self Care | Payer: BC Managed Care – PPO | Attending: Emergency Medicine | Admitting: Emergency Medicine

## 2019-11-14 DIAGNOSIS — W19XXXA Unspecified fall, initial encounter: Secondary | ICD-10-CM

## 2019-11-14 DIAGNOSIS — M25512 Pain in left shoulder: Secondary | ICD-10-CM | POA: Insufficient documentation

## 2019-11-14 DIAGNOSIS — E11621 Type 2 diabetes mellitus with foot ulcer: Secondary | ICD-10-CM | POA: Diagnosis not present

## 2019-11-14 DIAGNOSIS — S0081XA Abrasion of other part of head, initial encounter: Secondary | ICD-10-CM | POA: Insufficient documentation

## 2019-11-14 DIAGNOSIS — I10 Essential (primary) hypertension: Secondary | ICD-10-CM | POA: Diagnosis not present

## 2019-11-14 DIAGNOSIS — S42292A Other displaced fracture of upper end of left humerus, initial encounter for closed fracture: Secondary | ICD-10-CM | POA: Insufficient documentation

## 2019-11-14 DIAGNOSIS — L97509 Non-pressure chronic ulcer of other part of unspecified foot with unspecified severity: Secondary | ICD-10-CM | POA: Diagnosis not present

## 2019-11-14 DIAGNOSIS — M25461 Effusion, right knee: Secondary | ICD-10-CM | POA: Diagnosis not present

## 2019-11-14 DIAGNOSIS — Y92002 Bathroom of unspecified non-institutional (private) residence single-family (private) house as the place of occurrence of the external cause: Secondary | ICD-10-CM | POA: Diagnosis not present

## 2019-11-14 DIAGNOSIS — Y998 Other external cause status: Secondary | ICD-10-CM | POA: Diagnosis not present

## 2019-11-14 DIAGNOSIS — W010XXA Fall on same level from slipping, tripping and stumbling without subsequent striking against object, initial encounter: Secondary | ICD-10-CM | POA: Insufficient documentation

## 2019-11-14 DIAGNOSIS — E039 Hypothyroidism, unspecified: Secondary | ICD-10-CM | POA: Diagnosis not present

## 2019-11-14 DIAGNOSIS — Z7984 Long term (current) use of oral hypoglycemic drugs: Secondary | ICD-10-CM | POA: Insufficient documentation

## 2019-11-14 DIAGNOSIS — Y9389 Activity, other specified: Secondary | ICD-10-CM | POA: Diagnosis not present

## 2019-11-14 MED ORDER — CYCLOBENZAPRINE HCL 10 MG PO TABS
10.0000 mg | ORAL_TABLET | Freq: Once | ORAL | Status: AC
  Administered 2019-11-14: 10 mg via ORAL
  Filled 2019-11-14: qty 1

## 2019-11-14 MED ORDER — CYCLOBENZAPRINE HCL 10 MG PO TABS: 10.0000 mg | ORAL_TABLET | Freq: Two times a day (BID) | ORAL | 0 refills | Status: DC

## 2019-11-14 MED ORDER — KETOROLAC TROMETHAMINE 60 MG/2ML IM SOLN
60.0000 mg | Freq: Once | INTRAMUSCULAR | Status: AC
  Administered 2019-11-14: 60 mg via INTRAMUSCULAR
  Filled 2019-11-14: qty 2

## 2019-11-14 MED ORDER — NAPROXEN 500 MG PO TABS: 500.0000 mg | ORAL_TABLET | Freq: Two times a day (BID) | ORAL | 0 refills | Status: DC

## 2019-11-14 NOTE — Discharge Instructions (Addendum)
Please call to set up appointment with orthopedics. Keep you arm in the sling until you follow up with orthopedics You can take naproxen and flexeril for pain.

## 2019-11-14 NOTE — ED Notes (Signed)
ED Provider at bedside. 

## 2019-11-14 NOTE — ED Notes (Signed)
Pt to radiology.

## 2019-11-14 NOTE — ED Triage Notes (Signed)
Pt reports left shoulder pain, facial pain, right knee pain after going to the bathroom this morning. Reports she is unsure what happened. No LOC.

## 2019-11-14 NOTE — ED Notes (Signed)
Reports taking vistaril this morning for pain control, no other OTC pain meds on board. EDP consulted to radiology orders.

## 2019-11-14 NOTE — ED Provider Notes (Signed)
[facility] EMERGENCY DEPARTMENT Provider Note   CSN: [id number] Arrival date & time: 11/14/19  0522     History Chief Complaint  Patient presents with  . Megan Brooks is a 54 y.o. female.  54 y/o female with a past history of hypertension, hypothyroidism, and diabetes presents for fall that occurred about 3 hours ago. States she was walking back from the bathroom when she tripped and fell hitting her chin and left shoulder on the foot board of her bed before falling to the floor, able to get up and walk after falling and does not remember loss of consciousness. Since then she has noticed a dull pain in the front of her shoulder and is unable to lift her right arm due to the pain. She also notes pain swelling to the left side of her chin radiating to her jaw, has noticed an bleeding or loose teeth and is able to move her jaw without issue. She also has mild left knee pain and swelling. She denies loss of consciousness with the fall or precipitating factors causing the fall. She denies chest pain, shortness of breath, trouble swallowing, dizziness, weakness, loss of bowel or bladder function, or lightheadedness. Husband did not notice any seizure like activity. She is not on blood thinners.   Fall     Past Medical History:  Diagnosis Date  . 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 Atypical nevus 09/05/2004   slight-right side   . Atypical nevus 09/05/2004   slight-mod-left scapula  . Atypical nevus 08/08/2009   mild-mod- right low lateral back  . Cancer ([place])   . Diabetes mellitus type 2 in obese ([place]) 01/2016  . Hypertension   . Hypothyroid   . Melanoma ([place]) 09/05/2004   in situ- right neck ([facility])  . Obesity, Class II, BMI 35-39.9, with comorbidity     Patient Active Problem List   Diagnosis Date Noted  . Type 2 diabetes mellitus with diabetic foot infection ([place]) 05/29/2016  . Type II diabetes mellitus with foot ulcer ([place]) 05/29/2016   . Obesity 05/29/2016    Past Surgical History:  Procedure Laterality Date  . LAPAROSCOPIC VAGINAL HYSTERECTOMY WITH SALPINGO OOPHORECTOMY  2001     OB History   No obstetric history on file.     Family History  Problem Relation Age of Onset  . Muscular dystrophy Mother   . Cancer - Colon Father 47  . Leukemia Father        due to CTX  . Cancer Other   . Hypertension Other   . Obesity Other   . Diabetes Other     Social History   Tobacco Use  . Smoking status: Never Smoker  . Smokeless tobacco: Never Used  Substance Use Topics  . Alcohol use: No  . Drug use: No    Home Medications Prior to Admission medications   Medication Sig Start Date End Date Taking? Authorizing Provider  Cholecalciferol (VITAMIN D3) 50 MCG (2000 UT) capsule Take 2,000 Units by mouth daily. 02/18/18   [provider]  Cholecalciferol 50 MCG (2000 UT) CAPS cholecalciferol (vitamin D3) 50 mcg (2,000 unit) capsule  TAKE 1 CAPSULE BY MOUTH EVERY DAY    [provider]  cyclobenzaprine (FLEXERIL) 10 MG tablet Take 1 tablet (10 mg total) by mouth in the morning and at bedtime. 11/14/19   Iona Beard, MD  Diclofenac Sodium (PENNSAID) 2 % SOLN Pen[place]aid 20 mg/gram/actuation (2 %) topical  soln in metered-dose pump  APPLY 2 PUMPS (40 MG) TO THE AFFECTED AREA BY TOPICAL ROUTE 2 TIMES PER DAY    [provider]  doxycycline (VIBRAMYCIN) 100 MG capsule Take 1 capsule (100 mg total) by mouth 2 (two) times daily. 05/03/16   Wallene Huh, DPM  hydrochlorothiazide (HYDRODIURIL) 25 MG tablet 1 TABLET ONCE A DAY 90 DAYS 02/08/16   [provider]  hydrocortisone cream 1 %  05/05/18   [provider]  hydrocortisone cream 1 % hydrocortisone 1 % topical cream    [provider]  hydrOXYzine (VISTARIL) 25 MG capsule Take 25 mg by mouth 3 (three) times daily as needed. 06/25/19   [provider]  levothyroxine (SYNTHROID, LEVOTHROID) 25 MCG tablet Take 25  mcg by mouth daily. 01/25/16   [provider]  losartan (COZAAR) 25 MG tablet Take 25 mg by mouth daily. 11/20/15   [provider]  metFORMIN (GLUCOPHAGE) 500 MG tablet 1 TABLET WITH MEALS TWICE A DAY ORALLY 90 12/27/15   [provider]  metoprolol succinate (TOPROL-XL) 100 MG 24 hr tablet Take 100 mg by mouth daily. 02/13/16   [provider]  minocycline (MINOCIN,DYNACIN) 100 MG capsule minocycline 100 mg capsule    [provider]  naproxen (NAPROSYN) 500 MG tablet Take 1 tablet (500 mg total) by mouth 2 (two) times daily with a meal. 11/14/19   Iona Beard, MD  triamcinolone (NASACORT) 55 MCG/ACT AERO nasal inhaler 2 sprays daily. 01/24/16   [provider]  triamcinolone cream (KENALOG) 0.1 % Apply 1 application topically daily as needed. Avoid face, groin, underarms 10/29/19   [place], Vida Roller R, PA-C  Vitamin D, Ergocalciferol, (DRISDOL) 50000 units CAPS capsule TAKE 1 CAPSULE ONCE A WEEK ORALLY 02/08/16   [provider]    Allergies    Sulfamethoxazole, Codeine, Sulfa antibiotics, and Tape  Review of Systems   Review of Systems  Constitutional: Negative for activity change and fever.  HENT: Positive for facial swelling. Negative for nosebleeds and trouble swallowing.   Eyes: Negative for pain and visual disturbance.  Respiratory: Negative for chest tightness.   Cardiovascular: Negative for palpitations.  Gastrointestinal: Negative for nausea and vomiting.  Endocrine: Negative for cold intolerance and heat intolerance.  Genitourinary: Negative for dysuria and flank pain.  Musculoskeletal: Positive for joint swelling. Negative for neck stiffness.       Left shoulder pain with difficulty lifting arm. right knee pain and swelling  Skin: Negative for rash and wound.  Neurological: Negative for dizziness, syncope, weakness, light-headedness and numbness.  Psychiatric/Behavioral: Negative for agitation and confusion.     Physical Exam Updated Vital Signs BP (!) 148/64   Pulse 74   Temp 98.7 F (37.1 C) (Oral)   Resp 16   SpO2 99%   Physical Exam Constitutional:      Appearance: Normal appearance. She is normal weight.  HENT:     Head:     Comments: Swelling of right chin with small abrasion, no ecchymosis, no stridoe    Nose: Nose normal.  Eyes:     Extraocular Movements: Extraocular movements intact.     Pupils: Pupils are equal, round, and reactive to light.  Cardiovascular:     Rate and Rhythm: Normal rate and regular rhythm.     Pulses: Normal pulses.     Heart sounds: Normal heart sounds.  Pulmonary:     Effort: Pulmonary effort is normal.     Breath sounds: Normal breath sounds.  Abdominal:     General: Abdomen is flat. Bowel sounds are normal.     Palpations: Abdomen is soft.  Musculoskeletal:        General: No deformity.     Cervical back: Normal range of motion and neck supple.     Comments: Left Shoulder:Tenderness to palpation of left anterior should with inability to flex the shoulder above the horizontal secondary to pain. Neurovascularly intact. Right knee: Tenderness to palpation right patella. No joint line tenderness, no crepitus, no joint laxity  Skin:    Capillary Refill: Capillary refill takes less than 2 seconds.     Findings: No bruising.     Comments: Small 2 cm abrasion on right chin  Neurological:     General: No focal deficit present.     Mental Status: She is alert and oriented to person, place, and time. Mental status is at baseline.     Sensory: No sensory deficit.     Motor: No weakness.     ED Results / Procedures / Treatments   Labs (all labs ordered are listed, but only abnormal results are displayed) Labs Reviewed - No data to display  EKG None  Radiology CT Shoulder Left Wo Contrast  Result Date: 11/14/2019 CLINICAL DATA:  Left shoulder pain after fall.  Abnormal x-ray EXAM: CT OF THE UPPER LEFT EXTREMITY WITHOUT CONTRAST TECHNIQUE:  Multidetector CT imaging of the upper left extremity was performed according to the standard protocol. COMPARISON:  X-ray 11/14/2019 FINDINGS: Bones/Joint/Cartilage Acute mildly comminuted fracture involving the lesser tuberosity of the proximal left humerus (series 4, image 42). Fracture fragment measures 1.8 x 0.8 x 1.4 cm. The fracture fragment is internally rotated and medially displaced by approximately 1.7 cm along the anterior margin of the glenohumeral joint. No fracture involvement of the greater tuberosity or humeral neck. Glenohumeral joint alignment is maintained without dislocation. Glenohumeral joint space is maintained. Small glenohumeral joint effusion/hemarthrosis. Intact AC joint with mild arthropathy. The remaining visualized osseous structures are intact. Ligaments Suboptimally assessed by CT. Muscles and Tendons Subscapularis muscle appears enlarged and edematous with increased density likely intramuscular hemorrhage (series 9, image 66). Likely avulsion fracture related to the subscapularis tendon. Rotator cuff muscle bulk is normal without atrophy or fatty infiltration. Soft tissues No well-defined soft tissue hematoma. No axillary lymphadenopathy. Visualized portion of the left lung is clear. IMPRESSION: Acute mildly comminuted and displaced fracture involving the lesser tuberosity of the proximal left humerus. Findings likely represent avulsion fracture related to the subscapularis tendon. Electronically Signed   By: Davina Poke D.O.   On: 11/14/2019 09:20   DG Shoulder Left  Result Date: 11/14/2019 CLINICAL DATA:  Initial evaluation for acute trauma, fall. EXAM: LEFT SHOULDER - 2+ VIEW COMPARISON:  None. FINDINGS: On AP view, an osseous fragment is seen overlying the inferior aspect of the left glenohumeral articulation, concerning for fracture. This is suspected to arise from the posterior/superior aspect of the humeral head. Glenoid itself grossly intact. AC joint approximated. No  visible soft tissue injury. Visualized left hemithorax clear. IMPRESSION: Osseous fragment overlying the inferior aspect of the left glenohumeral articulation, concerning for acute fracture. Further evaluation with cross-sectional imaging could be performed for further characterization of these findings. Electronically Signed   By: Jeannine Boga M.D.   On: 11/14/2019 07:58   DG Knee Complete 4 Views Right  Result Date: 11/14/2019 CLINICAL DATA:  Initial evaluation for acute trauma, fall. EXAM: RIGHT KNEE - COMPLETE 4+ VIEW COMPARISON:  None. FINDINGS: No acute  fracture dislocation. No joint effusion. Moderate tricompartmental osteoarthritic changes. Osseous mineralization normal. No visible soft tissue injury. IMPRESSION: 1. No acute osseous abnormality about the right knee. 2. Moderate tricompartmental degenerative osteoarthrosis. Electronically Signed   By: Jeannine Boga M.D.   On: 11/14/2019 08:00   CT Maxillofacial Wo Contrast  Result Date: 11/14/2019 CLINICAL DATA:  [place] going to the bathroom this morning. Facial pain. EXAM: CT MAXILLOFACIAL WITHOUT CONTRAST TECHNIQUE: Multidetector CT imaging of the maxillofacial structures was performed. Multiplanar CT image reconstructions were also generated. COMPARISON:  None. FINDINGS: Osseous: No fracture or mandibular dislocation. No destructive process. Orbits: Negative. No traumatic or inflammatory finding. Sinuses: Clear. Soft tissues: Negative. Limited intracranial: No significant or unexpected finding. IMPRESSION: 1. Negative exam.  No fracture. Electronically Signed   By: Lajean Manes M.D.   On: 11/14/2019 07:32    Procedures Procedures (including critical care time)  Medications Ordered in ED Medications  ketorolac (TORADOL) injection 60 mg (60 mg Intramuscular Given 11/14/19 0830)  cyclobenzaprine (FLEXERIL) tablet 10 mg (10 mg Oral Given 11/14/19 8338)    ED Course  I have reviewed the triage vital signs and the nursing  notes.  Pertinent labs & imaging results that were available during my care of the patient were reviewed by me and considered in my medical decision making (see chart for details).    MDM Rules/Calculators/A&P                          54 y/o female who presents today for mechanical fall with left shoulder pain, right knee pain, and chin pain. Xray of left shoulder concerning for humeral fracture. CT of left shoulder with comminuted and displaced fracture proximal left humerus. Xray of right knee and maxillofacial Ct negative. No concern for head injury, she denies  LOC, weakness, numbness tingling, seizure, neck pain, or difficulty swallowing. Shoulder placed in a sling. IM Toradol and flexeril. Provided with ortho follow up and naproxen and flexeril for pain. Final Clinical Impression(s) / ED Diagnoses Final diagnoses:  Fall, initial encounter  Other closed displaced fracture of proximal end of left humerus, initial encounter    Rx / DC Orders ED Discharge Orders         Ordered    cyclobenzaprine (FLEXERIL) 10 MG tablet  2 times daily     Discontinue  Reprint     11/14/19 0946    naproxen (NAPROSYN) 500 MG tablet  2 times daily with meals     Discontinue  Reprint     11/14/19 0946           Iona Beard, MD 11/14/19 1024    Blanchie Dessert, MD 11/14/19 1621

## 2019-11-19 ENCOUNTER — Other Ambulatory Visit (HOSPITAL_COMMUNITY)
Admission: RE | Admit: 2019-11-19 | Discharge: 2019-11-19 | Disposition: A | Discharge status: Home / Self Care | Payer: BC Managed Care – PPO | Source: Ambulatory Visit | Attending: Orthopedic Surgery | Admitting: Orthopedic Surgery

## 2019-11-19 ENCOUNTER — Encounter (HOSPITAL_COMMUNITY): Payer: Self-pay | Admitting: Orthopedic Surgery

## 2019-11-19 DIAGNOSIS — Z01812 Encounter for preprocedural laboratory examination: Secondary | ICD-10-CM | POA: Insufficient documentation

## 2019-11-19 DIAGNOSIS — Z20822 Contact with and (suspected) exposure to covid-19: Secondary | ICD-10-CM | POA: Diagnosis not present

## 2019-11-19 LAB — SARS CORONAVIRUS 2 (TAT 6-24 HRS): SARS Coronavirus 2: NEGATIVE

## 2019-11-19 NOTE — Progress Notes (Addendum)
Anesthesia Chart Review: SAME DAY WORK-UP  Case: [id number] Date/Time: 11/22/19 0950   Procedure: Open left shoulder open reduction internal fixation (Left ) - 2 hrs   Anesthesia type: Choice   Pre-op diagnosis: Left shoulder proximal humerus fracture   Location: [facility] ROOM 05 / [facility]   Surgeons: Nicholes Stairs, MD      DISCUSSION: Patient is a 54 year old female scheduled for the above procedure.  History includes never smoker, postoperative and/V, hypothyroidism, HTN, DM2, HLD, anxiety, melanoma in situ (right neck, s/p MOHS 2006), hysterectomy (2001), morbid obesity.  ED visit 11/14/19 following fall in the middle of the night (denied LOC) and sustained a left humerus fracture. She was placed in a sling and set up for orthopedic follow-up.  11/19/2019 presurgical COVID-19 test is in process. She is a same day work-up. She does not have current EKG and labs. Will attempt to get copies of these and last office note from Dr. Drema Dallas to have available as comparison--but if not done recently will need labs and EKG on arrival for surgery.   ADDENDUM 11/19/19 6:06 PM:  Received surgical clearance note from PCP Dr. Drema Dallas which included 11/18/19 CBC with diff, CMET and EKG (summarized below). She wrote, "Low risk for cardiovascular complications with surgery."   VS: Ht 5\' 3"  (1.6 m)   Wt 136.1 kg   LMP  (LMP Unknown)   BMI 53.14 kg/m  On 11/14/19, BP 148/64, HR 74.    PROVIDERSLeighton Ruff, MD PCP at [facility] at [facility] is with [place]: Labs on 11/18/19 done at [place] include an A1c of 7.4%, WBC 7.5, H/H 15.2/42.9, PLT 246, glucose 90, BUN 10, Cr 0.71, Na 141, K 3.9, Ca 9.7, total Bili 1.3, ALP 68, AST 22, ALT 20.     IMAGES: CT Left shoulder 11/14/19: IMPRESSION: Acute mildly comminuted and displaced fracture involving the lesser tuberosity of the proximal left humerus. Findings likely represent avulsion fracture related to the  subscapularis tendon.   EKG: 11/18/19 [facility]): NSR    CV: Denied prior stress test, echo, cardiac cath.   Past Medical History:  Diagnosis Date  . Anxiety   . 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 Atypical nevus 09/05/2004   slight-right side   . Atypical nevus 09/05/2004   slight-mod-left scapula  . Atypical nevus 08/08/2009   mild-mod- right low lateral back  . Cancer ([place])   . Diabetes mellitus type 2 in obese ([place]) 01/2016   diet controlled, no meds  . HLD (hyperlipidemia)    diet controlled, no meds  . Hypertension   . Hypothyroid   . Melanoma ([place]) 09/05/2004   in situ- right neck (MOHS)  . Obesity, Class II, BMI 35-39.9, with comorbidity   . PONV (postoperative nausea and vomiting)     Past Surgical History:  Procedure Laterality Date  . ADENOIDECTOMY    . CESAREAN SECTION     x 1  . COLONOSCOPY    . LAPAROSCOPIC VAGINAL HYSTERECTOMY WITH SALPINGO OOPHORECTOMY  2001  . WISDOM TOOTH EXTRACTION      MEDICATIONS: No current facility-administered medications for this encounter.   . Cholecalciferol (VITAMIN D3) 50 MCG (2000 UT) capsule  . cyclobenzaprine (FLEXERIL) 10 MG tablet  . hydrochlorothiazide (HYDRODIURIL) 25 MG tablet  . hydrOXYzine (VISTARIL) 25 MG capsule  . levothyroxine (SYNTHROID, LEVOTHROID) 25 MCG tablet  . losartan (COZAAR) 25 MG tablet  . metoprolol succinate (TOPROL-XL) 100 MG 24  hr tablet  . naproxen (NAPROSYN) 500 MG tablet  . triamcinolone (NASACORT) 55 MCG/ACT AERO nasal inhaler  . traMADol (ULTRAM) 50 MG tablet  . triamcinolone cream (KENALOG) 0.1 %  Not taking triamcinolone cream and has note started tramadol as of 11/17/19.    Myra Gianotti, PA-C Surgical Short Stay/Anesthesiology [facility] Phone [phone] [facility] Phone [phone] 11/19/2019 11:45 AM

## 2019-11-19 NOTE — Progress Notes (Signed)
PCP - Dr Leighton Ruff Cardiologist - n/a  Chest x-ray - n/a EKG - DOS 11/22/19 Stress Test - n/a ECHO - n/a Cardiac Cath - n/a  ERAS:  Clears til 7 am DOS, no drink.  STOP now taking any Aspirin (unless otherwise instructed by your surgeon), Aleve, Naproxen, Ibuprofen, Motrin, Advil, Goody's, BC's, all herbal medications, fish oil, and all vitamins.   Coronavirus Screening Covid test scheduled on 11/19/19. Do you have any of the following symptoms:  Cough yes/no: No Fever (>100.65F)  yes/no: No Runny nose yes/no: No Sore throat yes/no: No Difficulty breathing/shortness of breath  yes/no: No  Have you traveled in the last 14 days and where? yes/no: No  Patient verbalized understanding of instructions that were given via phone.

## 2019-11-19 NOTE — Anesthesia Preprocedure Evaluation (Addendum)
Anesthesia Evaluation  Patient identified by MRN, date of birth, ID band Patient awake    Reviewed: Allergy & Precautions, NPO status , Patient's Chart, lab work & pertinent test results, reviewed documented beta blocker date and time   History of Anesthesia Complications (+) PONV and history of anesthetic complications  Airway Mallampati: IV  TM Distance: >3 FB Neck ROM: Full  Mouth opening: Limited Mouth Opening Comment: TMJ Dental  (+) Teeth Intact, Dental Advisory Given   Pulmonary neg pulmonary ROS,    Pulmonary exam normal breath sounds clear to auscultation       Cardiovascular hypertension, Pt. on medications and Pt. 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 Has had xrays and full dental evaluation done as well as CT facial bones- all negative. Still has some bruising of gums lower jaw but no bleeding.  Reproductive/Obstetrics negative OB ROS                           Anesthesia Physical Anesthesia Plan  ASA: III  Anesthesia Plan: General and Regional   Post-op Pain Management: GA combined w/ Regional for post-op pain   Induction: Intravenous  PONV Risk Score and Plan: 4 or greater and Ondansetron, Dexamethasone, Midazolam, Scopolamine patch - Pre-op and Treatment may vary due to age or medical condition  Airway Management Planned:  Oral ETT and Video Laryngoscope Planned  Additional Equipment: None  Intra-op Plan:   Post-operative Plan: Extubation in OR  Informed Consent: I have reviewed the patients History and Physical, chart, labs and discussed the procedure including the risks, benefits and alternatives for the proposed anesthesia with the patient or authorized representative who has indicated his/her understanding and acceptance.     Dental advisory given  Plan Discussed with: CRNA  Anesthesia Plan Comments: (   )     Anesthesia Quick Evaluation

## 2019-11-22 ENCOUNTER — Encounter (HOSPITAL_COMMUNITY)
Admission: RE | Disposition: A | Discharge status: Home / Self Care | Payer: Self-pay | Source: Home / Self Care | Attending: Orthopedic Surgery

## 2019-11-22 ENCOUNTER — Ambulatory Visit (HOSPITAL_COMMUNITY)
Admission: RE | Admit: 2019-11-22 | Discharge: 2019-11-22 | Disposition: A | Discharge status: Home / Self Care | Payer: BC Managed Care – PPO | Attending: Orthopedic Surgery | Admitting: Orthopedic Surgery

## 2019-11-22 ENCOUNTER — Ambulatory Visit (HOSPITAL_COMMUNITY): Payer: BC Managed Care – PPO | Admitting: Vascular Surgery

## 2019-11-22 ENCOUNTER — Other Ambulatory Visit: Payer: Self-pay

## 2019-11-22 ENCOUNTER — Encounter (HOSPITAL_COMMUNITY): Payer: Self-pay | Admitting: Orthopedic Surgery

## 2019-11-22 DIAGNOSIS — I10 Essential (primary) hypertension: Secondary | ICD-10-CM | POA: Diagnosis not present

## 2019-11-22 DIAGNOSIS — Z882 Allergy status to sulfonamides status: Secondary | ICD-10-CM | POA: Diagnosis not present

## 2019-11-22 DIAGNOSIS — E785 Hyperlipidemia, unspecified: Secondary | ICD-10-CM | POA: Diagnosis not present

## 2019-11-22 DIAGNOSIS — Z791 Long term (current) use of non-steroidal anti-inflammatories (NSAID): Secondary | ICD-10-CM | POA: Diagnosis not present

## 2019-11-22 DIAGNOSIS — Z6841 Body Mass Index (BMI) 40.0 and over, adult: Secondary | ICD-10-CM | POA: Insufficient documentation

## 2019-11-22 DIAGNOSIS — E119 Type 2 diabetes mellitus without complications: Secondary | ICD-10-CM | POA: Insufficient documentation

## 2019-11-22 DIAGNOSIS — S42202A Unspecified fracture of upper end of left humerus, initial encounter for closed fracture: Secondary | ICD-10-CM | POA: Insufficient documentation

## 2019-11-22 DIAGNOSIS — E039 Hypothyroidism, unspecified: Secondary | ICD-10-CM | POA: Diagnosis not present

## 2019-11-22 DIAGNOSIS — W19XXXA Unspecified fall, initial encounter: Secondary | ICD-10-CM | POA: Diagnosis not present

## 2019-11-22 DIAGNOSIS — Z79890 Hormone replacement therapy (postmenopausal): Secondary | ICD-10-CM | POA: Diagnosis not present

## 2019-11-22 DIAGNOSIS — Z79899 Other long term (current) drug therapy: Secondary | ICD-10-CM | POA: Diagnosis not present

## 2019-11-22 DIAGNOSIS — F419 Anxiety disorder, unspecified: Secondary | ICD-10-CM | POA: Diagnosis not present

## 2019-11-22 DIAGNOSIS — Z85820 Personal history of malignant melanoma of skin: Secondary | ICD-10-CM | POA: Diagnosis not present

## 2019-11-22 DIAGNOSIS — Z888 Allergy status to other drugs, medicaments and biological substances status: Secondary | ICD-10-CM | POA: Diagnosis not present

## 2019-11-22 DIAGNOSIS — Z885 Allergy status to narcotic agent status: Secondary | ICD-10-CM | POA: Diagnosis not present

## 2019-11-22 HISTORY — DX: Hyperlipidemia, unspecified: E78.5

## 2019-11-22 HISTORY — DX: Other specified postprocedural states: Z98.890

## 2019-11-22 HISTORY — DX: Anxiety disorder, unspecified: F41.9

## 2019-11-22 HISTORY — DX: Nausea with vomiting, unspecified: R11.2

## 2019-11-22 HISTORY — PX: ORIF HUMERUS FRACTURE: SHX2126

## 2019-11-22 LAB — GLUCOSE, CAPILLARY
Glucose-Capillary: 120 mg/dL — ABNORMAL HIGH (ref 70–99)
Glucose-Capillary: 117 mg/dL — ABNORMAL HIGH (ref 70–99)

## 2019-11-22 SURGERY — OPEN REDUCTION INTERNAL FIXATION (ORIF) PROXIMAL HUMERUS FRACTURE
Anesthesia: Regional | Laterality: Left

## 2019-11-22 MED ORDER — ACETAMINOPHEN 500 MG PO TABS
1000.0000 mg | ORAL_TABLET | Freq: Once | ORAL | Status: AC
  Administered 2019-11-22: 1000 mg via ORAL
  Filled 2019-11-22: qty 2

## 2019-11-22 MED ORDER — TRANEXAMIC ACID-NACL 1000-0.7 MG/100ML-% IV SOLN
INTRAVENOUS | Status: AC
  Filled 2019-11-22: qty 100

## 2019-11-22 MED ORDER — VANCOMYCIN HCL 1000 MG IV SOLR
INTRAVENOUS | Status: DC | PRN
  Administered 2019-11-22: 1000 mg via TOPICAL

## 2019-11-22 MED ORDER — VANCOMYCIN HCL 1000 MG IV SOLR
INTRAVENOUS | Status: AC
  Filled 2019-11-22: qty 1000

## 2019-11-22 MED ORDER — LACTATED RINGERS IV SOLN: INTRAVENOUS | Status: DC | PRN

## 2019-11-22 MED ORDER — MIDAZOLAM HCL 2 MG/2ML IJ SOLN
INTRAMUSCULAR | Status: AC
  Filled 2019-11-22: qty 2

## 2019-11-22 MED ORDER — SUGAMMADEX SODIUM 500 MG/5ML IV SOLN
INTRAVENOUS | Status: DC | PRN
Start: 2019-11-22 — End: 2019-11-22
  Administered 2019-11-22: 300 mg via INTRAVENOUS

## 2019-11-22 MED ORDER — PROPOFOL 10 MG/ML IV BOLUS
INTRAVENOUS | Status: DC | PRN
  Administered 2019-11-22: 200 mg via INTRAVENOUS

## 2019-11-22 MED ORDER — LIDOCAINE 2% (20 MG/ML) 5 ML SYRINGE
INTRAMUSCULAR | Status: DC | PRN
  Administered 2019-11-22: 20 mg via INTRAVENOUS

## 2019-11-22 MED ORDER — PHENYLEPHRINE HCL-NACL 10-0.9 MG/250ML-% IV SOLN
INTRAVENOUS | Status: DC | PRN
  Administered 2019-11-22: 30 ug/min via INTRAVENOUS
  Administered 2019-11-22: 50 ug/min via INTRAVENOUS

## 2019-11-22 MED ORDER — BUPIVACAINE HCL (PF) 0.25 % IJ SOLN
INTRAMUSCULAR | Status: DC | PRN
Start: 2019-11-22 — End: 2019-11-22
  Administered 2019-11-22: 15 mL

## 2019-11-22 MED ORDER — PROMETHAZINE HCL 25 MG/ML IJ SOLN
6.2500 mg | Freq: Once | INTRAMUSCULAR | Status: AC
  Administered 2019-11-22: 6.25 mg via INTRAVENOUS

## 2019-11-22 MED ORDER — OXYCODONE HCL 5 MG PO TABS: 5.0000 mg | ORAL_TABLET | ORAL | 0 refills | Status: AC | PRN

## 2019-11-22 MED ORDER — CEFAZOLIN SODIUM-DEXTROSE 2-4 GM/100ML-% IV SOLN
2.0000 g | INTRAVENOUS | Status: AC
  Administered 2019-11-22: 2 g via INTRAVENOUS
  Filled 2019-11-22: qty 100

## 2019-11-22 MED ORDER — SCOPOLAMINE 1 MG/3DAYS TD PT72
MEDICATED_PATCH | TRANSDERMAL | Status: AC
  Filled 2019-11-22: qty 1

## 2019-11-22 MED ORDER — PROPOFOL 10 MG/ML IV BOLUS
INTRAVENOUS | Status: AC
  Filled 2019-11-22: qty 20

## 2019-11-22 MED ORDER — FENTANYL CITRATE (PF) 250 MCG/5ML IJ SOLN
INTRAMUSCULAR | Status: DC | PRN
  Administered 2019-11-22: 100 ug via INTRAVENOUS

## 2019-11-22 MED ORDER — HYDROMORPHONE HCL 1 MG/ML IJ SOLN: 0.2500 mg | INTRAMUSCULAR | Status: DC | PRN

## 2019-11-22 MED ORDER — FENTANYL CITRATE (PF) 100 MCG/2ML IJ SOLN
INTRAMUSCULAR | Status: AC
  Administered 2019-11-22: 50 ug via INTRAVENOUS
  Filled 2019-11-22: qty 2

## 2019-11-22 MED ORDER — PHENYLEPHRINE 40 MCG/ML (10ML) SYRINGE FOR IV PUSH (FOR BLOOD PRESSURE SUPPORT)
PREFILLED_SYRINGE | INTRAVENOUS | Status: DC | PRN
  Administered 2019-11-22: 80 ug via INTRAVENOUS
  Administered 2019-11-22: 120 ug via INTRAVENOUS

## 2019-11-22 MED ORDER — FENTANYL CITRATE (PF) 100 MCG/2ML IJ SOLN: 50.0000 ug | Freq: Once | INTRAMUSCULAR | Status: AC

## 2019-11-22 MED ORDER — KETOROLAC TROMETHAMINE 30 MG/ML IJ SOLN: 30.0000 mg | Freq: Once | INTRAMUSCULAR | Status: AC | PRN

## 2019-11-22 MED ORDER — 0.9 % SODIUM CHLORIDE (POUR BTL) OPTIME
TOPICAL | Status: DC | PRN
  Administered 2019-11-22: 1000 mL

## 2019-11-22 MED ORDER — SCOPOLAMINE 1 MG/3DAYS TD PT72
1.0000 | MEDICATED_PATCH | TRANSDERMAL | Status: DC
  Administered 2019-11-22: 1.5 mg via TRANSDERMAL
  Filled 2019-11-22: qty 1

## 2019-11-22 MED ORDER — AMISULPRIDE (ANTIEMETIC) 5 MG/2ML IV SOLN
10.0000 mg | Freq: Once | INTRAVENOUS | Status: AC | PRN
  Administered 2019-11-22: 10 mg via INTRAVENOUS

## 2019-11-22 MED ORDER — ORAL CARE MOUTH RINSE: 15.0000 mL | Freq: Once | OROMUCOSAL | Status: AC

## 2019-11-22 MED ORDER — PHENYLEPHRINE 40 MCG/ML (10ML) SYRINGE FOR IV PUSH (FOR BLOOD PRESSURE SUPPORT)
PREFILLED_SYRINGE | INTRAVENOUS | Status: AC
  Filled 2019-11-22: qty 10

## 2019-11-22 MED ORDER — PROMETHAZINE HCL 25 MG/ML IJ SOLN
INTRAMUSCULAR | Status: AC
  Filled 2019-11-22: qty 1

## 2019-11-22 MED ORDER — ROCURONIUM BROMIDE 10 MG/ML (PF) SYRINGE
PREFILLED_SYRINGE | INTRAVENOUS | Status: DC | PRN
  Administered 2019-11-22: 40 mg via INTRAVENOUS

## 2019-11-22 MED ORDER — SUCCINYLCHOLINE CHLORIDE 20 MG/ML IJ SOLN
INTRAMUSCULAR | Status: DC | PRN
Start: 2019-11-22 — End: 2019-11-22

## 2019-11-22 MED ORDER — TRANEXAMIC ACID-NACL 1000-0.7 MG/100ML-% IV SOLN
1000.0000 mg | INTRAVENOUS | Status: AC
  Administered 2019-11-22: 1000 mg via INTRAVENOUS

## 2019-11-22 MED ORDER — OXYCODONE HCL 5 MG PO TABS: 5.0000 mg | ORAL_TABLET | Freq: Once | ORAL | Status: DC | PRN

## 2019-11-22 MED ORDER — LIDOCAINE 2% (20 MG/ML) 5 ML SYRINGE
INTRAMUSCULAR | Status: AC
  Filled 2019-11-22: qty 5

## 2019-11-22 MED ORDER — DEXAMETHASONE SODIUM PHOSPHATE 10 MG/ML IJ SOLN
INTRAMUSCULAR | Status: DC | PRN
  Administered 2019-11-22: 10 mg via INTRAVENOUS

## 2019-11-22 MED ORDER — OXYCODONE HCL 5 MG/5ML PO SOLN: 5.0000 mg | Freq: Once | ORAL | Status: DC | PRN

## 2019-11-22 MED ORDER — FENTANYL CITRATE (PF) 250 MCG/5ML IJ SOLN
INTRAMUSCULAR | Status: AC
  Filled 2019-11-22: qty 5

## 2019-11-22 MED ORDER — ONDANSETRON HCL 4 MG/2ML IJ SOLN
INTRAMUSCULAR | Status: DC | PRN
  Administered 2019-11-22: 4 mg via INTRAVENOUS

## 2019-11-22 MED ORDER — SCOPOLAMINE 1 MG/3DAYS TD PT72
1.0000 | MEDICATED_PATCH | TRANSDERMAL | Status: DC
  Administered 2019-11-22: 1.5 mg via TRANSDERMAL

## 2019-11-22 MED ORDER — ONDANSETRON 4 MG PO TBDP
4.0000 mg | ORAL_TABLET | Freq: Three times a day (TID) | ORAL | 0 refills | Status: DC | PRN
Start: 2019-11-22 — End: 2020-11-28

## 2019-11-22 MED ORDER — LACTATED RINGERS IV SOLN: INTRAVENOUS | Status: DC

## 2019-11-22 MED ORDER — SUGAMMADEX SODIUM 500 MG/5ML IV SOLN
INTRAVENOUS | Status: AC
  Filled 2019-11-22: qty 5

## 2019-11-22 MED ORDER — AMISULPRIDE (ANTIEMETIC) 5 MG/2ML IV SOLN
INTRAVENOUS | Status: AC
  Filled 2019-11-22: qty 4

## 2019-11-22 MED ORDER — SUCCINYLCHOLINE CHLORIDE 200 MG/10ML IV SOSY
PREFILLED_SYRINGE | INTRAVENOUS | Status: AC
  Filled 2019-11-22: qty 10

## 2019-11-22 MED ORDER — CHLORHEXIDINE GLUCONATE 0.12 % MT SOLN
15.0000 mL | Freq: Once | OROMUCOSAL | Status: AC
  Administered 2019-11-22: 15 mL via OROMUCOSAL
  Filled 2019-11-22: qty 15

## 2019-11-22 MED ORDER — MIDAZOLAM HCL 2 MG/2ML IJ SOLN
INTRAMUSCULAR | Status: AC
  Administered 2019-11-22: 2 mg via INTRAVENOUS
  Filled 2019-11-22: qty 2

## 2019-11-22 MED ORDER — SUCCINYLCHOLINE CHLORIDE 200 MG/10ML IV SOSY
PREFILLED_SYRINGE | INTRAVENOUS | Status: DC | PRN
  Administered 2019-11-22: 140 mg via INTRAVENOUS

## 2019-11-22 MED ORDER — MIDAZOLAM HCL 2 MG/2ML IJ SOLN: 2.0000 mg | Freq: Once | INTRAMUSCULAR | Status: AC

## 2019-11-22 MED ORDER — BUPIVACAINE LIPOSOME 1.3 % IJ SUSP
INTRAMUSCULAR | Status: DC | PRN
  Administered 2019-11-22: 10 mL via PERINEURAL

## 2019-11-22 MED ORDER — DEXAMETHASONE SODIUM PHOSPHATE 10 MG/ML IJ SOLN
INTRAMUSCULAR | Status: AC
  Filled 2019-11-22: qty 1

## 2019-11-22 MED ORDER — PROMETHAZINE HCL 25 MG/ML IJ SOLN
6.2500 mg | INTRAMUSCULAR | Status: DC | PRN
  Administered 2019-11-22: 6.25 mg via INTRAVENOUS

## 2019-11-22 MED ORDER — BUPIVACAINE-EPINEPHRINE (PF) 0.25% -1:200000 IJ SOLN
INTRAMUSCULAR | Status: AC
  Filled 2019-11-22: qty 30

## 2019-11-22 SURGICAL SUPPLY — 51 items
ANCHOR SUT BIO SW 4.75X19.1 (Anchor) ×4 IMPLANT
BIT DRILL 2.6 CANN (BIT) ×2 IMPLANT
CLSR STERI-STRIP ANTIMIC 1/2X4 (GAUZE/BANDAGES/DRESSINGS) ×2 IMPLANT
COVER SURGICAL LIGHT HANDLE (MISCELLANEOUS) ×2 IMPLANT
COVER WAND RF STERILE (DRAPES) ×2 IMPLANT
DRAPE INCISE IOBAN 66X45 STRL (DRAPES) ×2 IMPLANT
DRAPE ORTHO SPLIT 77X108 STRL (DRAPES) ×4
DRAPE SURG ORHT 6 SPLT 77X108 (DRAPES) ×2 IMPLANT
DRAPE U-SHAPE 47X51 STRL (DRAPES) ×2 IMPLANT
DRSG AQUACEL AG ADV 3.5X 6 (GAUZE/BANDAGES/DRESSINGS) ×2 IMPLANT
DRSG PAD ABDOMINAL 8X10 ST (GAUZE/BANDAGES/DRESSINGS) ×2 IMPLANT
DURAPREP 26ML APPLICATOR (WOUND CARE) ×2 IMPLANT
ELECT REM PT RETURN 9FT ADLT (ELECTROSURGICAL) ×2
ELECTRODE REM PT RTRN 9FT ADLT (ELECTROSURGICAL) ×1 IMPLANT
GLOVE BIO SURGEON STRL SZ7.5 (GLOVE) ×4 IMPLANT
GLOVE BIOGEL PI IND STRL 8 (GLOVE) ×1 IMPLANT
GLOVE BIOGEL PI INDICATOR 8 (GLOVE) ×1
GLOVE SURG SS PI 7.5 STRL IVOR (GLOVE) ×2 IMPLANT
GOWN STRL REUS W/ TWL LRG LVL3 (GOWN DISPOSABLE) ×1 IMPLANT
GOWN STRL REUS W/ TWL XL LVL3 (GOWN DISPOSABLE) ×1 IMPLANT
GOWN STRL REUS W/TWL LRG LVL3 (GOWN DISPOSABLE) ×2
GOWN STRL REUS W/TWL XL LVL3 (GOWN DISPOSABLE) ×2
KIT BASIN OR (CUSTOM PROCEDURE TRAY) ×2 IMPLANT
KIT TURNOVER KIT B (KITS) ×2 IMPLANT
MANIFOLD NEPTUNE II (INSTRUMENTS) ×2 IMPLANT
NEEDLE TAPERED W/ NITINOL LOOP (MISCELLANEOUS) ×4 IMPLANT
NS IRRIG 1000ML POUR BTL (IV SOLUTION) ×2 IMPLANT
PACK SHOULDER (CUSTOM PROCEDURE TRAY) ×2 IMPLANT
PAD ARMBOARD 7.5X6 YLW CONV (MISCELLANEOUS) ×4 IMPLANT
SCREW CANN THRD 4X30 (Screw) ×2 IMPLANT
SLING ARM FOAM STRAP LRG (SOFTGOODS) ×4 IMPLANT
SPONGE LAP 18X18 RF (DISPOSABLE) ×2 IMPLANT
SPONGE LAP 4X18 RFD (DISPOSABLE) ×2 IMPLANT
SUCTION FRAZIER HANDLE 10FR (MISCELLANEOUS) ×2
SUCTION TUBE FRAZIER 10FR DISP (MISCELLANEOUS) ×1 IMPLANT
SUT FIBERWIRE #2 38 T-5 BLUE (SUTURE)
SUT MNCRL AB 4-0 PS2 18 (SUTURE) IMPLANT
SUT VIC AB 0 CT1 27 (SUTURE) ×2
SUT VIC AB 0 CT1 27XBRD ANBCTR (SUTURE) ×1 IMPLANT
SUT VIC AB 2-0 CT1 27 (SUTURE) ×2
SUT VIC AB 2-0 CT1 TAPERPNT 27 (SUTURE) ×1 IMPLANT
SUTURE FIBERWR #2 38 T-5 BLUE (SUTURE) IMPLANT
SUTURE TAPE 1.3 40 TPR END (SUTURE) ×2 IMPLANT
SUTURETAPE 1.3 40 TPR END (SUTURE) ×4
SYR CONTROL 10ML LL (SYRINGE) ×2 IMPLANT
TAPE FIBER 2MM 7IN #2 BLUE (SUTURE) ×2 IMPLANT
TOWEL GREEN STERILE (TOWEL DISPOSABLE) ×2 IMPLANT
TOWEL GREEN STERILE FF (TOWEL DISPOSABLE) ×2 IMPLANT
WASHER ORTHO OD TITAN F/CA 7 (Washer) ×2 IMPLANT
WATER STERILE IRR 1000ML POUR (IV SOLUTION) ×2 IMPLANT
YANKAUER SUCT BULB TIP NO VENT (SUCTIONS) ×2 IMPLANT

## 2019-11-22 NOTE — Discharge Instructions (Signed)
Regional Anesthesia  Regional anesthesia is a method used to temporarily block feeling in one area of the body. You may have regional anesthesia before a medical procedure or surgery. A health care provider who specializes in giving anesthesia (anesthesiologist) injects a type of medicine near a nerve or a group of nerves. This medicine makes that area of the body numb. Regional anesthesia allows you to be awake during the procedure or surgery but keeps you from feeling pain in the affected area. There are three types of regional anesthesia:  Spinal anesthesia. This is a one-time injection of medicine into the fluid that surrounds your spinal cord. This numbs the area below and slightly above the injection site.  Epidural anesthesia. This is another medicine that may be placed into your back, but just outside of the protective tissue that covers your spinal cord. Instead of a one-time injection, the medicine is often given gradually over time through a small tube (catheter)that remains in your back for as long as pain control is needed.  Peripheral nerve block. This is an injection that is given in an area of the body other than the spine to block all feeling below the injection site. Peripheral nerve blocks may be given as a single injection before your procedure or may be given through a catheter for as long as you need pain control. Regional anesthesia can be used alone or in combination with other types of anesthesia. Compared to using medicine that makes you fall asleep (general anesthetic), regional anesthesia has many benefits, such as:  Improved pain control after your surgery.  Less nausea, vomiting, or drowsiness after surgery.  A faster recovery. Tell a health care provider about:  Any allergies you have.  All medicines you are taking, including vitamins, herbs, eye drops, creams, and over-the-counter medicines.  Any use of drugs, alcohol, or tobacco.  Any problems you or family  members have had with anesthetic medicines.  Any blood disorders you have.  Any surgeries you have had.  Any medical conditions you have or have had, especially heart failure, chronic obstructive pulmonary disease (COPD), or sleep apnea.  Whether you are pregnant or may be pregnant. What are the risks? Generally, this is a safe procedure. However, problems may occur, including:  Pain.  Nausea.  Vomiting.  Itching.  Low blood pressure.  Headache.  Nerve damage.  Infection.  Bleeding around the injection site.  Trouble urinating.  Allergic reactions to medicine. What happens before the procedure? Staying hydrated Follow instructions from your health care provider about hydration, which may include:  Up to 2 hours before the procedure - you may continue to drink clear liquids, such as water, clear fruit juice, black coffee, and plain tea. Eating and drinking restrictions Follow instructions from your health care provider about eating and drinking, which may include:  8 hours before the procedure - stop eating heavy meals or foods, such as meat, fried foods, or fatty foods.  6 hours before the procedure - stop eating light meals or foods, such as toast or cereal.  6 hours before the procedure - stop drinking milk or drinks that contain milk.  2 hours before the procedure - stop drinking clear liquids. Medicines Ask your health care provider about:  Changing or stopping your regular medicines. This is especially important if you are taking diabetes medicines or blood thinners.  Taking medicines such as aspirin and ibuprofen. These medicines can thin your blood. Do not take these medicines unless your health care provider   tells you to take them.  Taking over-the-counter medicines, vitamins, herbs, and supplements. General instructions  Plan to have someone take you home from the hospital or clinic.  If you will be going home right after the procedure, plan to  have someone with you for 24 hours.  You may need to have blood or imaging tests.  Ask your health care provider what steps will be taken to help prevent infection. These may include washing skin with a germ-killing soap.  If you use a sleep apnea device, ask your health care provider whether you should bring it with you on the day of your surgery. What happens during the procedure?  Depending on the medical procedure you are having done, an IV may be inserted into one of your veins.  The anesthesiologist will do a physical exam to find the best location to give the regional anesthesia. To locate the nerve, he or she may also use: ? A device that activates the nerve and causes your muscles to twitch (nerve stimulator). ? An imaging tool that uses sound waves to create images of the area (ultrasound).  You may be given a medicine to help you relax (sedative).  A medicine called a local anesthetic may be injected to numb the area where the regional anesthetic will be injected.  You will get regional anesthesia by injection or through a catheter.  The anesthesiologist will check to make sure the medicine is working before the rest of your medical procedure begins.  Depending on the type of regional anesthesia you received, you may have a small bandage (dressing) placed over the injection site. The procedure may vary among health care providers and hospitals. What can I expect after the procedure? After your procedure, it is common to have:  Sleepiness.  Nausea.  Itching.  Numbness.  Shivering or feeling cold. Your blood pressure, heart rate, breathing rate, and blood oxygen level will be monitored until you leave the hospital or clinic. Follow these instructions at home:  Do not drive for 24 hours if you were given a sedative during your procedure.  Take over-the-counter and prescription medicines only as told by your health care provider.  Do not drive, exercise, or do any  other activities that require coordination for 24 hours or as told by your health care provider. Ask your health care provider when you can return to your usual activities.  Drink enough fluid to keep your urine pale yellow.  If you had a dressing placed over the injection site, only remove it when told to do so by your health care provider.  Keep all follow-up visits as told by your health care provider. This is important. Contact a health care provider if you:  Continue to have nausea and vomiting for more than 1 day.  Develop a rash.  Have trouble urinating. Get help right away if you:  Have bleeding from the injection site or bleeding under the skin at the injection site.  Have redness, swelling, or pain around your injection site.  Have a fever.  Develop a headache.  Develop new numbness or weakness. Summary  Regional anesthesia is a method used to temporarily block feeling in one area of the body. It may be done to block pain during a medical procedure or surgery.  Follow instructions from your health care provider about taking medicines and about eating and drinking before the procedure.  Ask your health care provider when you can return to your usual activities after the procedure.  This information is not intended to replace advice given to you by your health care provider. Make sure you discuss any questions you have with your health care provider. Document Revised: 05/18/2018 Document Reviewed: 05/18/2018 Elsevier Patient Education  [place] Anesthesia, Adult, Care After This sheet gives you information about how to care for yourself after your procedure. Your health care provider may also give you more specific instructions. If you have problems or questions, contact your health care provider. What can I expect after the procedure? After the procedure, the following side effects are common:  Pain or discomfort at the IV  site.  Nausea.  Vomiting.  Sore throat.  Trouble concentrating.  Feeling cold or chills.  Weak or tired.  Sleepiness and fatigue.  Soreness and body aches. These side effects can affect parts of the body that were not involved in surgery. Follow these instructions at home:  For at least 24 hours after the procedure:  Have a responsible adult stay with you. It is important to have someone help care for you until you are awake and alert.  Rest as needed.  Do not: ? Participate in activities in which you could fall or become injured. ? Drive. ? Use heavy machinery. ? Drink alcohol. ? Take sleeping pills or medicines that cause drowsiness. ? Make important decisions or sign legal documents. ? Take care of children on your own. Eating and drinking  Follow any instructions from your health care provider about eating or drinking restrictions.  When you feel hungry, start by eating small amounts of foods that are soft and easy to digest (bland), such as toast. Gradually return to your regular diet.  Drink enough fluid to keep your urine pale yellow.  If you vomit, rehydrate by drinking water, juice, or clear broth. General instructions  If you have sleep apnea, surgery and certain medicines can increase your risk for breathing problems. Follow instructions from your health care provider about wearing your sleep device: ? Anytime you are sleeping, including during daytime naps. ? While taking prescription pain medicines, sleeping medicines, or medicines that make you drowsy.  Return to your normal activities as told by your health care provider. Ask your health care provider what activities are safe for you.  Take over-the-counter and prescription medicines only as told by your health care provider.  If you smoke, do not smoke without supervision.  Keep all follow-up visits as told by your health care provider. This is important. Contact a health care provider if:  You  have nausea or vomiting that does not get better with medicine.  You cannot eat or drink without vomiting.  You have pain that does not get better with medicine.  You are unable to pass urine.  You develop a skin rash.  You have a fever.  You have redness around your IV site that gets worse. Get help right away if:  You have difficulty breathing.  You have chest pain.  You have blood in your urine or stool, or you vomit blood. Summary  After the procedure, it is common to have a sore throat or nausea. It is also common to feel tired.  Have a responsible adult stay with you for the first 24 hours after general anesthesia. It is important to have someone help care for you until you are awake and alert.  When you feel hungry, start by eating small amounts of foods that are soft and easy to digest (bland), such as toast. Gradually  return to your regular diet.  Drink enough fluid to keep your urine pale yellow.  Return to your normal activities as told by your health care provider. Ask your health care provider what activities are safe for you. This information is not intended to replace advice given to you by your health care provider. Make sure you discuss any questions you have with your health care provider. Document Revised: 04/04/2017 Document Reviewed: 11/15/2016 Elsevier Patient Education  [place].

## 2019-11-22 NOTE — Anesthesia Procedure Notes (Signed)
Anesthesia Regional Block: Interscalene brachial plexus block   Pre-Anesthetic Checklist: ,, timeout performed, Correct Patient, Correct Site, Correct Laterality, Correct Procedure, Correct Position, site marked, Risks and benefits discussed,  Surgical consent,  Pre-op evaluation,  At surgeon's request and post-op pain management  Laterality: Left  Prep: Maximum Sterile Barrier Precautions used, chloraprep       Needles:  Injection technique: Single-shot  Needle Type: Echogenic Stimulator Needle     Needle Length: 9cm  Needle Gauge: 22     Additional Needles:   Procedures:,,,, ultrasound used (permanent image in chart),,,,  Narrative:  Start time: 11/22/2019 9:10 AM End time: 11/22/2019 9:19 AM Injection made incrementally with aspirations every 5 mL.  Performed by: Personally  Anesthesiologist: Pervis Hocking, DO  Additional Notes: Monitors applied. No increased pain on injection. No increased resistance to injection. Injection made in 5cc increments. Good needle visualization. Patient tolerated procedure well.

## 2019-11-22 NOTE — H&P (Signed)
ORTHOPAEDIC H and P  REQUESTING PHYSICIAN: Nicholes Stairs, MD  PCP:  Leighton Ruff, MD  Chief Complaint: Left shoulder pain  HPI: Megan Brooks is a 54 y.o. female who complains of left shoulder pain and swelling following a fall.  This did result in a dislocation of the left shoulder.  This was reduced in the emergency department last week.  Unfortunately, during the course of that episode she sustained a lesser tuberosity fracture with displacement.  She is here today for internal fixation of that fracture.  No new complaints at this time.  Past Medical History:  Diagnosis Date  . Anxiety   . 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 Atypical nevus 09/05/2004   slight-right side   . Atypical nevus 09/05/2004   slight-mod-left scapula  . Atypical nevus 08/08/2009   mild-mod- right low lateral back  . Cancer ([place])   . Diabetes mellitus type 2 in obese ([place]) 01/2016   diet controlled, no meds  . HLD (hyperlipidemia)    diet controlled, no meds  . Hypertension   . Hypothyroid   . Melanoma ([place]) 09/05/2004   in situ- right neck ([facility])  . Obesity, Class II, BMI 35-39.9, with comorbidity   . PONV (postoperative nausea and vomiting)    Past Surgical History:  Procedure Laterality Date  . ADENOIDECTOMY    . CESAREAN SECTION     x 1  . COLONOSCOPY    . LAPAROSCOPIC VAGINAL HYSTERECTOMY WITH SALPINGO OOPHORECTOMY  2001  . WISDOM TOOTH EXTRACTION     Social History   Socioeconomic History  . Marital status: Married    Spouse name: Not on file  . Number of children: Not on file  . Years of education: Not on file  . Highest education level: Not on file  Occupational History  . Not on file  Tobacco Use  . Smoking status: Never Smoker  . Smokeless tobacco: Never Used  Vaping Use  . Vaping Use: Never used  Substance and Sexual Activity  . Alcohol use: No  . Drug use: No  . Sexual activity: Not on file    Comment: Hysterectomy  Other  Topics Concern  . Not on file  Social History Narrative  . Not on file   Social Determinants of Health   Financial Resource Strain:   . Difficulty of Paying Living Expenses:   Food Insecurity:   . Worried About Charity fundraiser in the Last Year:   . Arboriculturist in the Last Year:   Transportation Needs:   . Film/video editor (Medical):   Marland Kitchen Lack of Transportation (Non-Medical):   Physical Activity:   . Days of Exercise per Week:   . Minutes of Exercise per Session:   Stress:   . Feeling of Stress :   Social Connections:   . Frequency of Communication with Friends and Family:   . Frequency of Social Gatherings with Friends and Family:   . Attends Religious Services:   . Active Member of Clubs or Organizations:   . Attends Archivist Meetings:   Marland Kitchen Marital Status:    Family History  Problem Relation Age of Onset  . Muscular dystrophy Mother   . Cancer - Colon Father 42  . Leukemia Father        due to CTX  . Cancer Other   . Hypertension Other   . Obesity Other   . Diabetes Other    Allergies  Allergen Reactions  . Sulfamethoxazole Swelling    Esophageal swelling. delayed  . Codeine Other (See Comments)    Night mares and sleep loss  . Sulfa Antibiotics Swelling  . Tape Other (See Comments)    Paper tape gives blisters   Prior to Admission medications   Medication Sig Start Date End Date Taking? Authorizing Provider  Cholecalciferol (VITAMIN D3) 50 MCG (2000 UT) capsule Take 2,000 Units by mouth daily. 02/18/18  Yes [provider]  cyclobenzaprine (FLEXERIL) 10 MG tablet Take 1 tablet (10 mg total) by mouth in the morning and at bedtime. 11/14/19  Yes Iona Beard, MD  hydrochlorothiazide (HYDRODIURIL) 25 MG tablet Take 25 mg by mouth daily.  02/08/16  Yes [provider]  hydrOXYzine (VISTARIL) 25 MG capsule Take 25 mg by mouth 3 (three) times daily as needed for anxiety.  06/25/19  Yes [provider]  levothyroxine  (SYNTHROID, LEVOTHROID) 25 MCG tablet Take 25 mcg by mouth daily. 01/25/16  Yes [provider]  losartan (COZAAR) 25 MG tablet Take 25 mg by mouth daily. 11/20/15  Yes [provider]  metoprolol succinate (TOPROL-XL) 100 MG 24 hr tablet Take 100 mg by mouth daily. 02/13/16  Yes [provider]  naproxen (NAPROSYN) 500 MG tablet Take 1 tablet (500 mg total) by mouth 2 (two) times daily with a meal. 11/14/19  Yes Iona Beard, MD  traMADol (ULTRAM) 50 MG tablet Take 50 mg by mouth 4 (four) times daily as needed for moderate pain.  11/17/19  Yes [provider]  triamcinolone (NASACORT) 55 MCG/ACT AERO nasal inhaler Place 2 sprays into the nose daily.  01/24/16  Yes [provider]  triamcinolone cream (KENALOG) 0.1 % Apply 1 application topically daily as needed. Avoid face, groin, underarms Patient not taking: Reported on 11/17/2019 10/29/19   Robyne Askew R, PA-C   No results found.  Positive ROS: All other systems have been reviewed and were otherwise negative with the exception of those mentioned in the HPI and as above.  Physical Exam: General: Alert, no acute distress Cardiovascular: No pedal edema Respiratory: No cyanosis, no use of accessory musculature GI: No organomegaly, abdomen is soft and non-tender Skin: No lesions in the area of chief complaint Neurologic: Sensation intact distally Psychiatric: Patient is competent for consent with normal mood and affect Lymphatic: No axillary or cervical lymphadenopathy  MUSCULOSKELETAL:  Left upper extremity is warm and well-perfused.  No obvious deformities or open wounds.  Assessment: Left proximal humerus fracture, lesser tuberosity.  Closed.  Plan: -Our plan will be for open reduction and internal fixation of this large fragment that is flipped 90 degrees.  I also imagine that we will have to perform a biceps tenodesis due to the likely unstable biceps with the absence subscapularis.  We  will do this in a concomitant fashion. -We again discussed the risk of bleeding, infection, damage to surrounding nerves and vessels, stiffness, nonunion, malunion, hardware failure, painful hardware, need for surgery further surgery and the risk of anesthesia.  She has provided informed consent.  All questions were solicited and answered to her satisfaction.  -Plan for discharge home postoperatively from [place], MD Cell [phone]    11/22/2019 9:47 AM

## 2019-11-22 NOTE — Brief Op Note (Signed)
11/22/2019  12:20 PM  PATIENT:  Vale Haven  54 y.o. female  PRE-OPERATIVE DIAGNOSIS:  Left shoulder proximal humerus fracture  POST-OPERATIVE DIAGNOSIS:  Left shoulder proximal humerus fracture  PROCEDURE:  Procedure(s) with comments: Open left shoulder open reduction internal fixation (Left) - 2 hrs  SURGEON:  Surgeon(s) and Role:    * Nicholes Stairs, MD - Primary  PHYSICIAN ASSISTANT:   ASSISTANTS: Jonelle Sidle, PA-C.   ANESTHESIA:   regional and general  EBL:  200 mL   BLOOD ADMINISTERED:none  DRAINS: none   LOCAL MEDICATIONS USED:  NONE  SPECIMEN:  No Specimen  DISPOSITION OF SPECIMEN:  N/A  COUNTS:  YES  TOURNIQUET:  * No tourniquets in log *  DICTATION: .Note written in EPIC  PLAN OF CARE: Discharge to home after PACU  PATIENT DISPOSITION:  PACU - hemodynamically stable.   Delay start of Pharmacological VTE agent (>24hrs) due to surgical blood loss or risk of bleeding: not applicable

## 2019-11-22 NOTE — Transfer of Care (Signed)
Immediate Anesthesia Transfer of Care Note  Patient: Megan Brooks  Procedure(s) Performed: Open left shoulder open reduction internal fixation (Left )  Patient Location: PACU  Anesthesia Type:General and Regional  Level of Consciousness: awake, alert  and oriented  Airway & Oxygen Therapy: Patient Spontanous Breathing and Patient connected to face mask oxygen  Post-op Assessment: Report given to RN and Post -op Vital signs reviewed and stable  Post vital signs: Reviewed and stable  Last Vitals:  Vitals Value Taken Time  BP 151/89 11/22/19 1227  Temp    Pulse 71 11/22/19 1228  Resp 19 11/22/19 1228  SpO2 98 % 11/22/19 1228  Vitals shown include unvalidated device data.  Last Pain:  Vitals:   11/22/19 0940  TempSrc:   PainSc: 0-No pain      Patients Stated Pain Goal: 4 (87/68/11 5726)  Complications: No complications documented.

## 2019-11-22 NOTE — Anesthesia Procedure Notes (Signed)
Procedure Name: Intubation Date/Time: 11/22/2019 10:12 AM Performed by: Harden Mo, CRNA Pre-anesthesia Checklist: Patient identified, Emergency Drugs available, Suction available and Patient being monitored Patient Re-evaluated:Patient Re-evaluated prior to induction Oxygen Delivery Method: Circle System Utilized Preoxygenation: Pre-oxygenation with 100% oxygen Induction Type: IV induction Ventilation: Mask ventilation with difficulty, Oral airway inserted - appropriate to patient size and Two handed mask ventilation required Laryngoscope Size: Glidescope and 3 Grade View: Grade I Tube type: Oral Tube size: 7.0 mm Number of attempts: 1 Airway Equipment and Method: Stylet and Oral airway Placement Confirmation: ETT inserted through vocal cords under direct vision,  positive ETCO2 and breath sounds checked- equal and bilateral Secured at: 22 cm Tube secured with: Tape Dental Injury: Teeth and Oropharynx as per pre-operative assessment

## 2019-11-22 NOTE — Op Note (Signed)
11/22/2019  6:25 PM  PATIENT:  Megan Brooks    PRE-OPERATIVE DIAGNOSIS:  Left shoulder proximal humerus fracture  POST-OPERATIVE DIAGNOSIS:  Same  PROCEDURE:  Open left shoulder open reduction internal fixation  SURGEON:  Nicholes Stairs, MD  PHYSICIAN ASSISTANT:  Jonelle Sidle, PA-C. Was present and scrubbed throughout the case, critical for completion in a timely fashion, and for retraction, instrumentation, and closure.  ANESTHESIA:   General with interscalene block  PREOPERATIVE INDICATIONS:  Megan Brooks is a  54 y.o. female with a diagnosis of Left shoulder proximal humerus fracture who elected for surgical management.    The risks benefits and alternatives were discussed with the patient including but not limited to the risks of nonoperative treatment, versus surgical intervention including infection, bleeding, nerve injury, malunion, nonunion, the need for revision surgery, hardware prominence, hardware failure, the need for hardware removal, blood clots, cardiopulmonary complications, conversion to arthroplasty, morbidity, mortality, among others, and they were willing to proceed.  Predicted outcome is good, although there will be at least a six to nine month expected recovery.   OPERATIVE IMPLANTS: Arthrex 4.75 mm swivel lock anchor x2  OPERATIVE FINDINGS: Displaced proximal humerus fracture, of the left lesser tuberosity  UNIQUE ASPECTS OF THE CASE:   This was a unique proximal humerus fracture and then it was isolated to the lesser tuberosity.  There was no propagation to the anatomic or surgical neck.  The subscapularis had pulled the lesser tuberosity fragment 90 degrees to its origin.  This had also migrated medially to the medial side of the conjoined tendon.  There was adhesion noted on the superior anterior and posterior aspect of the tendon.  Quite a bit of time was spent freeing up these adhesions to mobilize this fragment.  Care was taken throughout this  maneuvers to avoid the neurovascular structures on the deep and medial aspect of the conjoined tendon.  Furthermore, the bone fragment attached to the subscapularis was behaving more like a wafer of bone similar to a lesser tuberosity osteotomy.  It was mostly cancellous bone and a shell of cortical bone which may instrumentation a little bit difficult.  We therefore, elected to repair it in a suture anchor fashion to avoid loss of fixation with fragmentation of the lesser tuberosity piece.  OPERATIVE PROCEDURE: The patient was brought to the operating room and placed in the supine position. General anesthesia was administered. IV antibiotics were given. She was placed in the beach chair position. All bony prominences were padded. The upper extremity was prepped and draped in usual sterile fashion. Deltopectoral incision was performed.  The cephalic vein was mobilized and retracted medially throughout the case.  Deep retractors were placed and the fracture was identified.  There was fracture hematoma that was evacuated.  We immediately noted that the fractured portion of lesser tuberosity did not include any articular cartilage.  We had a very good view of the humeral head which was completely intact.  There was no fracture that propagated along the inferior calcar.  The biceps tendon was noted to be attached to the superior labrum but had been auto tenotomized through the fracture process.  We did remove the proximal biceps stump.  Next we spent some time to free up the lesser tuberosity fragment which was attached to the subscapularis tendon.  This had retracted medially.  The bone fragment also flipped about 100 degrees off axis and in a medial direction.  Care was taken to protect the neurovascular structures.  We had a good plane to work through deep to the conjoined tendon and superficial to the humeral head and anterior glenoid.  We then repaired performed 270 degree releases on the anterior, superior,  and posterior surface of the tendon.  We then attempted to fixate the lesser tuberosity bone fragment to the humeral head defect.  We provisionally clamped this with a point-to-point clamp, and 2 Kirschner wires.  Unfortunately, the bone wafer was so thin that it fragmented on attempted drilling.  We then elected to not instrument with screws due to concern for loss of fixation.    We ultimately elected to fix this with suture anchors.  We passed 2 separate Arthrex suture tapes in a horizontal mattress fashion just medial to the bone tendon interface.  We then placed a Mason-Allen style fiber tape suture medial to that.  This provided [place] with a total of 6 suture limbs.  We then dissected down to the intertubercular groove.  It was noted that the long head of the biceps was not present in the groove consistent with the proximal rupture.  We then utilized to separate 4.75 mm swivel lock anchors to hold each 3 suture limbs.  This created a solid reduction of the bony component and tendon component into the donor site on the anterior humeral head.  Once complete fixation and reduction of been achieved,irrigated the wounds copiously, and repaired the deltopectoral interval with Vicryl followed by Vicryl for the subcutaneous tissue with Monocryl and Steri-Strips for the skin. She was placed in a sling. She had a preoperative regional block as well. She tolerated the procedure well with no complications.  Disposition:  She will be in her sling for approximately 6 weeks.  She will be nonweightbearing for the same interval.  We will begin hand, elbow and wrist range of motion immediately.  She will begin pendulums and scapular retractions at 2 weeks.  We will slowly progress her to external rotation beyond neutral at about 4 weeks.  This will protect our repair.  Otherwise she will discharge home from PACU today.  I will see her back in the office in 2 weeks.  She may begin showering immediately.

## 2019-11-22 NOTE — Anesthesia Postprocedure Evaluation (Signed)
Anesthesia Post Note  Patient: Megan Brooks  Procedure(s) Performed: Open left shoulder open reduction internal fixation (Left )     Patient location during evaluation: PACU Anesthesia Type: Regional and General Level of consciousness: awake and alert, oriented and patient cooperative Pain management: pain level controlled Vital Signs Assessment: post-procedure vital signs reviewed and stable Respiratory status: spontaneous breathing, nonlabored ventilation and respiratory function stable Cardiovascular status: blood pressure returned to baseline and stable Postop Assessment: no apparent nausea or vomiting Anesthetic complications: no   No complications documented.  Last Vitals:  Vitals:   11/22/19 1345 11/22/19 1400  BP: (!) 143/86 (!) 150/90  Pulse: 80 79  Resp: 16 15  Temp:  36.6 C  SpO2: 94% 95%    Last Pain:  Vitals:   11/22/19 1400  TempSrc:   PainSc: 0-No pain                 Pervis Hocking

## 2019-11-24 ENCOUNTER — Encounter (HOSPITAL_COMMUNITY): Payer: Self-pay | Admitting: Orthopedic Surgery

## 2019-11-30 ENCOUNTER — Encounter (HOSPITAL_COMMUNITY): Payer: Self-pay | Admitting: Orthopedic Surgery

## 2020-01-10 ENCOUNTER — Telehealth: Payer: Self-pay | Admitting: *Deleted

## 2020-01-10 NOTE — Telephone Encounter (Signed)
Refill request for Metronidazole 2%/1% hydrocortisone cream sent to [facility].

## 2020-06-08 ENCOUNTER — Ambulatory Visit: Payer: BC Managed Care – PPO | Admitting: Neurology

## 2020-11-28 ENCOUNTER — Encounter: Payer: Self-pay | Admitting: Physician Assistant

## 2020-11-28 ENCOUNTER — Ambulatory Visit: Payer: BC Managed Care – PPO | Admitting: Physician Assistant

## 2020-11-28 ENCOUNTER — Other Ambulatory Visit: Payer: Self-pay

## 2020-11-28 DIAGNOSIS — B079 Viral wart, unspecified: Secondary | ICD-10-CM

## 2020-11-28 DIAGNOSIS — L820 Inflamed seborrheic keratosis: Secondary | ICD-10-CM

## 2020-11-28 DIAGNOSIS — Z1283 Encounter for screening for malignant neoplasm of skin: Secondary | ICD-10-CM | POA: Diagnosis not present

## 2020-11-28 DIAGNOSIS — L918 Other hypertrophic disorders of the skin: Secondary | ICD-10-CM

## 2020-11-29 NOTE — Progress Notes (Signed)
   Follow-Up Visit   Subjective  Megan Brooks is a 55 y.o. female who presents for the following: Skin Problem (Re check left cheek. We froze it last time but patient thinks its coming back. Also has a few skin crusts that are raised around her neck  and on her neck she wants removed. ).   The following portions of the chart were reviewed this encounter and updated as appropriate:  Tobacco  Allergies  Meds  Problems  Med Hx  Surg Hx  Fam Hx      Objective  Well appearing patient in no apparent distress; mood and affect are within normal limits.  All skin waist up examined.  Left Malar Cheek Verrucous papules -- Discussed viral etiology and contagion.   Neck - Anterior (20) Papular and plaque like crusts. Numerous with pink base.     Assessment & Plan  Viral warts, unspecified type Left Malar Cheek  Destruction of lesion - Left Malar Cheek Complexity: simple   Destruction method: cryotherapy   Informed consent: discussed and consent obtained   Timeout:  patient name, date of birth, surgical site, and procedure verified Lesion destroyed using liquid nitrogen: Yes   Cryotherapy cycles:  1 Outcome: patient tolerated procedure well with no complications   Post-procedure details: wound care instructions given    Inflamed seborrheic keratosis Neck - Anterior  Destruction of lesion - Neck - Anterior Complexity: simple   Destruction method: cryotherapy   Destruction method comment:  Scissors were used to snip tag at the base Informed consent: discussed and consent obtained   Timeout:  patient name, date of birth, surgical site, and procedure verified Anesthesia: the lesion was anesthetized in a standard fashion   Lesion destroyed using liquid nitrogen: Yes   Hemostasis achieved with:  pressure Outcome: patient tolerated procedure well with no complications   Post-procedure details: wound care instructions given      I, Traivon Morrical, PA-C, have reviewed all  documentation's for this visit.  The documentation on 11/29/20 for the exam, diagnosis, procedures and orders are all accurate and complete.

## 2021-03-01 ENCOUNTER — Other Ambulatory Visit: Payer: Self-pay | Admitting: Nurse Practitioner

## 2021-03-01 DIAGNOSIS — M8580 Other specified disorders of bone density and structure, unspecified site: Secondary | ICD-10-CM

## 2021-07-03 ENCOUNTER — Other Ambulatory Visit: Payer: Self-pay | Admitting: Gastroenterology

## 2021-09-04 ENCOUNTER — Encounter (HOSPITAL_COMMUNITY): Payer: Self-pay | Admitting: Gastroenterology

## 2021-09-17 NOTE — Anesthesia Preprocedure Evaluation (Signed)
Anesthesia Evaluation  Patient identified by MRN, date of birth, ID band Patient awake    Reviewed: Allergy & Precautions, NPO status , Patient's Chart, lab work & pertinent test results  History of Anesthesia Complications (+) PONV and history of anesthetic complications  Airway Mallampati: III  TM Distance: >3 FB Neck ROM: Full    Dental no notable dental hx. (+) Caps, Teeth Intact, Dental Advisory Given   Pulmonary neg pulmonary ROS,    Pulmonary exam normal breath sounds clear to auscultation       Cardiovascular hypertension, Pt. on medications Normal cardiovascular exam Rhythm:Regular Rate:Normal     Neuro/Psych Anxiety    GI/Hepatic Neg liver ROS,   Endo/Other  diabetes, Type 2Hypothyroidism Morbid obesity (BMI 53)  Renal/GU      Musculoskeletal negative musculoskeletal ROS (+)   Abdominal   Peds  Hematology   Anesthesia Other Findings All: Sulfa Codeine Tape  Reproductive/Obstetrics                            Anesthesia Physical Anesthesia Plan  ASA: 3  Anesthesia Plan: MAC   Post-op Pain Management:    Induction:   PONV Risk Score and Plan: Treatment may vary due to age or medical condition  Airway Management Planned: Natural Airway and Nasal Cannula  Additional Equipment: None  Intra-op Plan:   Post-operative Plan:   Informed Consent: I have reviewed the patients History and Physical, chart, labs and discussed the procedure including the risks, benefits and alternatives for the proposed anesthesia with the patient or authorized representative who has indicated his/her understanding and acceptance.     Dental advisory given  Plan Discussed with:   Anesthesia Plan Comments: (Hx colon polyps)       Anesthesia Quick Evaluation

## 2021-09-17 NOTE — H&P (Signed)
History of present illness 56 year old female, history of adenoma removed from colon Colonoscopy 2012 Colonoscopy 2006  Current Medications  Taking   Align 4 MG Capsule 1 capsule by mouth once a day  D-Mannose 500 MG Capsule 4 capsule( '2000mg'$  ) by mouth once a day, Notes: BRAKE , OTC  EC-Naproxen(Naproxen) 500 MG Tablet Delayed Release TAKE 1 TABLET BY MOUTH EVERY 12 HOURS AS NEEDED   Estradiol 0.1 MG/GM Cream Apply pea size amt Vaginal prn, Notes: BRAKE  hydroCHLOROthiazide 25 MG Tablet TAKE 1 TABLET BY MOUTH DAILY   Levothyroxine Sodium 25 MCG Tablet TAKE 1 TABLET BY MOUTH ONCE A DAY IN THE MORNING ON AN EMPTY STOMACH Orally Once a day  Losartan Potassium 25 MG Tablet 1 tablet by mouth Once a day  metFORMIN HCl ER 500 MG Tablet Extended Release 24 Hour 1 tablet with evening meal Orally Once a day  Metoprolol Succinate ER 100 MG Tablet Extended Release 24 Hour 1 tablet by mouth Once a day  Nasacort AQ(Triamcinolone Acetonide) 55 MCG/ACT Aerosol Solution USE 2 SPRAYS IN EACH NOSTRIL ONCE DAILY . once a day  Vitamin D3 50 MCG (2000 UT) Capsule TAKE 1 CAPSULE BY MOUTH EVERY DAY , Notes: pt needs labs  Discontinued   Probiotic - Tablet Delayed Release as directed Orally , Notes: OTC  Supplement: . . D Mannose , Notes: OTC  Medication List reviewed and reconciled with the patient   Past Medical History  Hypertension.   hypothyroidism (previously medicated- recent TSH labs have been stable off of med).   Hypercholesterolemia.   recurrent UTI [facility]).   melanoma (09/2004) Dr. Denna Haggard.   Rosacea.   colon polyps - adenomatous, on colonoscopy 06/2010 (due in 3 yrs)- Collene Mares.   family history of FSHD, Facioscapulohumeral Dystrophy, in both her mother and her brother. .   diabetes diagnosed 4/17 (patient declines statins and ACE inhibitor).   decreased pedal pulses, normal ABI 10/07/16.   Mild diabetic retinopathy.    Surgical History  hysterectomy and BSO 01/2000   cesarean section 1990  left shoulder surgery 11/22/19  colonoscopy 2006/2012   Family History  Father: deceased, leukemia (died at age 2, diagnosed with Hypertension, Colon cancer  Mother: deceased, muscular dystrophy, diagnosed with Diabetes, Hypertension  Paternal [place] Father: deceased  Paternal Grand Mother: deceased, breast cancer  Maternal Grand Father: deceased  Maternal Grand Mother: deceased  Brother 1: alive, hypertension, muscular dystrophy  Sister 1: alive  1 brother(s) , 1 sister(s) . 1 son(s) , 1 daughter(s) .    Social History  General:  Tobacco use  cigarettes: Never smoked Tobacco history last updated 07/03/2021 Vaping No no Alcohol.  no Recreational drug use.  Marital Status: married.  Children: 1 son, 1 daughter.  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    Allergies  Sulfa Drugs: anaphylaxis - Allergy - Criticality High  Lisinopril: cough - Side Effects  Codeine (for allergy): bad dreams - Side Effects  Morphine Sulfate: nightmares - Side Effects  Metformin HCl: diarrhea - Side Effects   Hospitalization/Major Diagnostic Procedure  childbirth x 2   Not in the past year 06/2021   Review of Systems  GI PROCEDURE:  Pacemaker/ AICD no. Artificial heart valves no. MI/heart attack no. Abnormal heart rhythm no. Angina no. CVA no. Hypertension YES. Hypotension no. Asthma, COPD no. Sleep apnea no. Seizure disorders no. Artificial joints no. Severe DJD no. Diabetes YES, type II. Significant headaches no. Vertigo no. Depression/anxiety no. Abnormal bleeding no. Kidney Disease  no. Liver disease no. Blood transfusion no.   Vital Signs Wt 305.6, Wt change 2.4 lbs, Ht 64, BMI 52.45. T98.48F, BP 188/87, PR 86, RR 13, saturating 98% on room air Obese No pallor, no icterus Respiration unlabored Regular heart sounds Nondistended abdomen Alert, awake, oriented x3  Assessment and plan: History of adenoma of colon Morbid obesity, BMI more than  50 Surveillance colonoscopy at the hospital.

## 2021-09-18 ENCOUNTER — Encounter (HOSPITAL_COMMUNITY)
Admission: RE | Disposition: A | Discharge status: Home / Self Care | Payer: Self-pay | Source: Ambulatory Visit | Attending: Gastroenterology

## 2021-09-18 ENCOUNTER — Ambulatory Visit (HOSPITAL_COMMUNITY): Payer: BC Managed Care – PPO | Admitting: Certified Registered Nurse Anesthetist

## 2021-09-18 ENCOUNTER — Other Ambulatory Visit: Payer: Self-pay

## 2021-09-18 ENCOUNTER — Ambulatory Visit (HOSPITAL_COMMUNITY)
Admission: RE | Admit: 2021-09-18 | Discharge: 2021-09-18 | Disposition: A | Discharge status: Home / Self Care | Payer: BC Managed Care – PPO | Source: Ambulatory Visit | Attending: Gastroenterology | Admitting: Gastroenterology

## 2021-09-18 DIAGNOSIS — I10 Essential (primary) hypertension: Secondary | ICD-10-CM | POA: Diagnosis not present

## 2021-09-18 DIAGNOSIS — Z6841 Body Mass Index (BMI) 40.0 and over, adult: Secondary | ICD-10-CM | POA: Insufficient documentation

## 2021-09-18 DIAGNOSIS — Z1211 Encounter for screening for malignant neoplasm of colon: Secondary | ICD-10-CM | POA: Diagnosis not present

## 2021-09-18 DIAGNOSIS — E119 Type 2 diabetes mellitus without complications: Secondary | ICD-10-CM | POA: Diagnosis not present

## 2021-09-18 DIAGNOSIS — Z86010 Personal history of colonic polyps: Secondary | ICD-10-CM | POA: Diagnosis not present

## 2021-09-18 DIAGNOSIS — K648 Other hemorrhoids: Secondary | ICD-10-CM | POA: Diagnosis not present

## 2021-09-18 DIAGNOSIS — D123 Benign neoplasm of transverse colon: Secondary | ICD-10-CM | POA: Insufficient documentation

## 2021-09-18 HISTORY — PX: POLYPECTOMY: SHX5525

## 2021-09-18 HISTORY — PX: COLONOSCOPY WITH PROPOFOL: SHX5780

## 2021-09-18 LAB — GLUCOSE, CAPILLARY: Glucose-Capillary: 104 mg/dL — ABNORMAL HIGH (ref 70–99)

## 2021-09-18 SURGERY — COLONOSCOPY WITH PROPOFOL
Anesthesia: Monitor Anesthesia Care

## 2021-09-18 MED ORDER — PROPOFOL 10 MG/ML IV BOLUS
INTRAVENOUS | Status: DC | PRN
  Administered 2021-09-18 (×2): 20 mg via INTRAVENOUS

## 2021-09-18 MED ORDER — PROPOFOL 1000 MG/100ML IV EMUL
INTRAVENOUS | Status: AC
  Filled 2021-09-18: qty 200

## 2021-09-18 MED ORDER — SODIUM CHLORIDE 0.9 % IV SOLN: INTRAVENOUS | Status: DC

## 2021-09-18 MED ORDER — LACTATED RINGERS IV SOLN: INTRAVENOUS | Status: DC

## 2021-09-18 MED ORDER — PROPOFOL 500 MG/50ML IV EMUL
INTRAVENOUS | Status: DC | PRN
  Administered 2021-09-18: 125 ug/kg/min via INTRAVENOUS

## 2021-09-18 SURGICAL SUPPLY — 22 items

## 2021-09-18 NOTE — Addendum Note (Signed)
Addendum  created 09/18/21 1854 by West Pugh, CRNA   Intraprocedure Meds edited

## 2021-09-18 NOTE — Op Note (Signed)
[facility] Patient Name: Megan Brooks Procedure Date: 09/18/2021 MRN: [id number] Attending MD: Ronnette Juniper , MD Date of Birth: [birth year] CSN: [id number] Age: 56 Admit Type: Outpatient Procedure:                Colonoscopy Indications:              High risk colon cancer surveillance: Personal                            history of non-advanced adenoma, Last colonoscopy:                            2012 Providers:                Ronnette Juniper, MD, Mikey College, RN, Benetta Spar, Technician Referring MD:             Donna Christen Medicines:                Monitored Anesthesia Care Complications:            No immediate complications. Estimated blood loss:                            Minimal. Estimated Blood Loss:     Estimated blood loss was minimal. Procedure:                Pre-Anesthesia Assessment:                           - Prior to the procedure, a History and Physical                            was performed, and patient medications and                            allergies were reviewed. The patient's tolerance of                            previous anesthesia was also reviewed. The risks                            and benefits of the procedure and the sedation                            options and risks were discussed with the patient.                            All questions were answered, and informed consent                            was obtained. Prior Anticoagulants: The patient has                            taken no previous anticoagulant or antiplatelet  agents. ASA Grade Assessment: III - A patient with                            severe systemic disease. After reviewing the risks                            and benefits, the patient was deemed in                            satisfactory condition to undergo the procedure.                           After obtaining informed consent, the colonoscope                             was passed under direct vision. Throughout the                            procedure, the patient's blood pressure, pulse, and                            oxygen saturations were monitored continuously. The                            PCF-HQ190L ([id number]) Olympus colonoscope was                            introduced through the anus and advanced to the the                            terminal ileum. The colonoscopy was performed                            without difficulty. The patient tolerated the                            procedure well. The quality of the bowel                            preparation was fair. Scope In: 9:02:16 AM Scope Out: 9:24:01 AM Scope Withdrawal Time: 0 hours 16 minutes 38 seconds  Total Procedure Duration: 0 hours 21 minutes 45 seconds  Findings:      The perianal and digital rectal examinations were normal.      The terminal ileum appeared normal.      Five sessile polyps were found in the transverse colon(4) and hepatic       flexure(1). The polyps were 6 to 9 mm in size. These polyps were removed       with a hot snare. Resection and retrieval were complete.      Non-bleeding internal hemorrhoids were found during retroflexion. The       hemorrhoids were small.      Undigested food partilces/ stool was found in the sigmoid colon, in the       descending colon, in the ascending colon and in the cecum,  making       visualization difficult. Lavage of the area was performed, resulting in       clearance with fair visualization.      There was a small lipoma, at the hepatic flexure. Impression:               - Preparation of the colon was fair.                           - The examined portion of the ileum was normal.                           - Five 6 to 9 mm polyps in the transverse colon and                            at the hepatic flexure, removed with a hot snare.                            Resected and retrieved.                            - Non-bleeding internal hemorrhoids.                           - Stool in the sigmoid colon, in the descending                            colon, in the ascending colon and in the cecum.                           - Small lipoma at the hepatic flexure. Moderate Sedation:      Patient did not receive moderate sedation for this procedure, but       instead received monitored anesthesia care. Recommendation:           - Patient has a contact number available for                            emergencies. The signs and symptoms of potential                            delayed complications were discussed with the                            patient. Return to normal activities tomorrow.                            Written discharge instructions were provided to the                            patient.                           - Resume regular diet.                           -  Continue present medications.                           - Await pathology results.                           - Repeat colonoscopy for surveillance based on                            pathology results. Procedure Code(s):        --- Professional ---                           [phone], Colonoscopy, flexible; with removal of                            tumor(s), polyp(s), or other lesion(s) by snare                            technique Diagnosis Code(s):        --- Professional ---                           Z86.010, Personal history of colonic polyps                           K64.8, Other hemorrhoids                           K63.5, Polyp of colon CPT copyright 2019 [employer]. All rights reserved. The codes documented in this report are preliminary and upon coder review may  be revised to meet current compliance requirements. Ronnette Juniper, MD 09/18/2021 9:26:07 AM This report has been signed electronically. Number of Addenda: 0

## 2021-09-18 NOTE — Transfer of Care (Signed)
Immediate Anesthesia Transfer of Care Note  Patient: Megan Brooks  Procedure(s) Performed: COLONOSCOPY WITH PROPOFOL HOT HEMOSTASIS (ARGON PLASMA COAGULATION/BICAP)  Patient Location: PACU and Endoscopy Unit  Anesthesia Type:MAC  Level of Consciousness: awake and patient cooperative  Airway & Oxygen Therapy: Patient Spontanous Breathing and Patient connected to face mask oxygen  Post-op Assessment: Report given to RN and Post -op Vital signs reviewed and stable  Post vital signs: Reviewed and stable  Last Vitals:  Vitals Value Taken Time  BP    Temp    Pulse    Resp    SpO2      Last Pain:  Vitals:   09/18/21 0817  TempSrc: Tympanic  PainSc: 0-No pain         Complications: No notable events documented.

## 2021-09-18 NOTE — Anesthesia Postprocedure Evaluation (Signed)
Anesthesia Post Note  Patient: Megan Brooks  Procedure(s) Performed: COLONOSCOPY WITH PROPOFOL POLYPECTOMY     Patient location during evaluation: Endoscopy Anesthesia Type: MAC Level of consciousness: awake and alert Pain management: pain level controlled Vital Signs Assessment: post-procedure vital signs reviewed and stable Respiratory status: spontaneous breathing, nonlabored ventilation, respiratory function stable and patient connected to nasal cannula oxygen Cardiovascular status: blood pressure returned to baseline and stable Postop Assessment: no apparent nausea or vomiting Anesthetic complications: no   No notable events documented.  Last Vitals:  Vitals:   09/18/21 0926 09/18/21 0940  BP: 128/73 124/80  Pulse: 92 84  Resp: 15 (!) 21  Temp: (!) 36.3 C   SpO2:  100%    Last Pain:  Vitals:   09/18/21 0940  TempSrc:   PainSc: 0-No pain                 Barnet Glasgow

## 2021-09-18 NOTE — Discharge Instructions (Signed)

## 2021-09-18 NOTE — Anesthesia Procedure Notes (Addendum)
Procedure Name: MAC Date/Time: 09/18/2021 8:56 AM Performed by: West Pugh, CRNA Pre-anesthesia Checklist: Patient identified, Emergency Drugs available, Suction available, Patient being monitored and Timeout performed Oxygen Delivery Method: Simple face mask Preoxygenation: Pre-oxygenation with 100% oxygen Placement Confirmation: positive ETCO2 Dental Injury: Teeth and Oropharynx as per pre-operative assessment

## 2021-09-19 ENCOUNTER — Encounter (HOSPITAL_COMMUNITY): Payer: Self-pay | Admitting: Gastroenterology

## 2021-09-19 LAB — SURGICAL PATHOLOGY

## 2022-03-20 IMAGING — CT CT SHOULDER*L* W/O CM
1 of 3 series · 4 of 14 positions shown, 5 images · non-contrast
Comparison: X-ray 11/14/2019

CLINICAL DATA: Left shoulder pain after fall.  Abnormal x-ray

EXAM:
CT OF THE UPPER LEFT EXTREMITY WITHOUT CONTRAST
TECHNIQUE: Multidetector CT imaging of the upper left extremity was performed
according to the standard protocol.

[Series 9: sag st · oblique · 0.29mm/px · 4 of 140 slices shown, 5 images]
[im 28/140  soft-tissue]
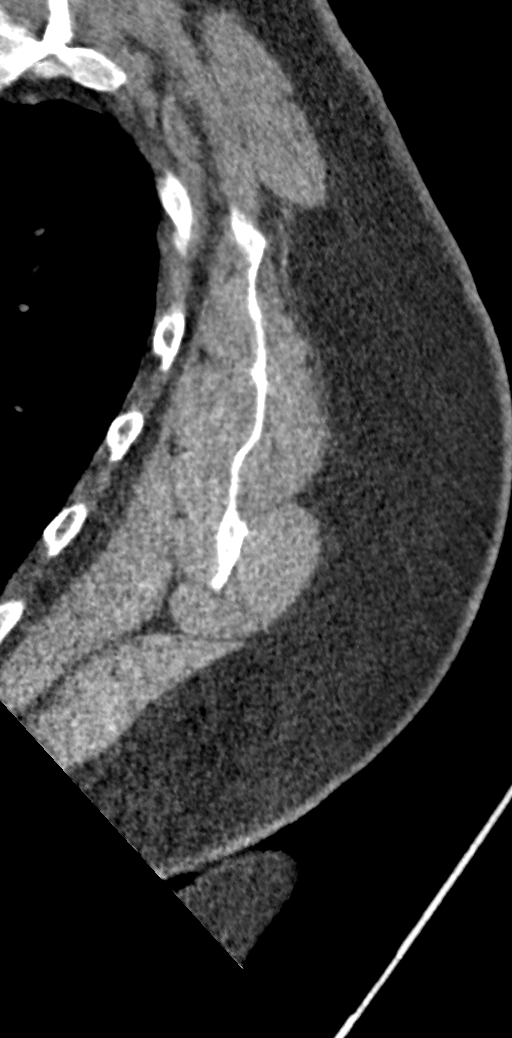
[im 28/140  bone]
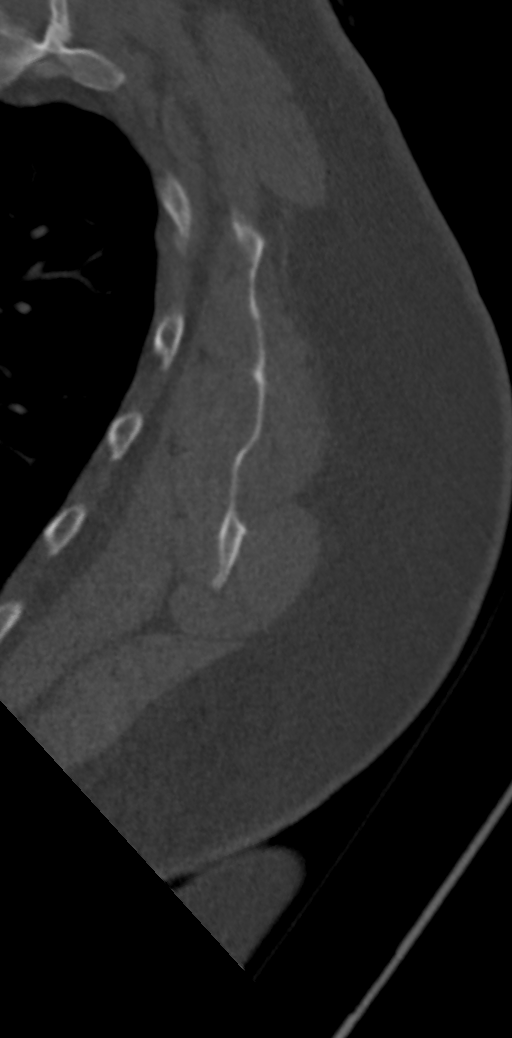
[im 56/140  bone]
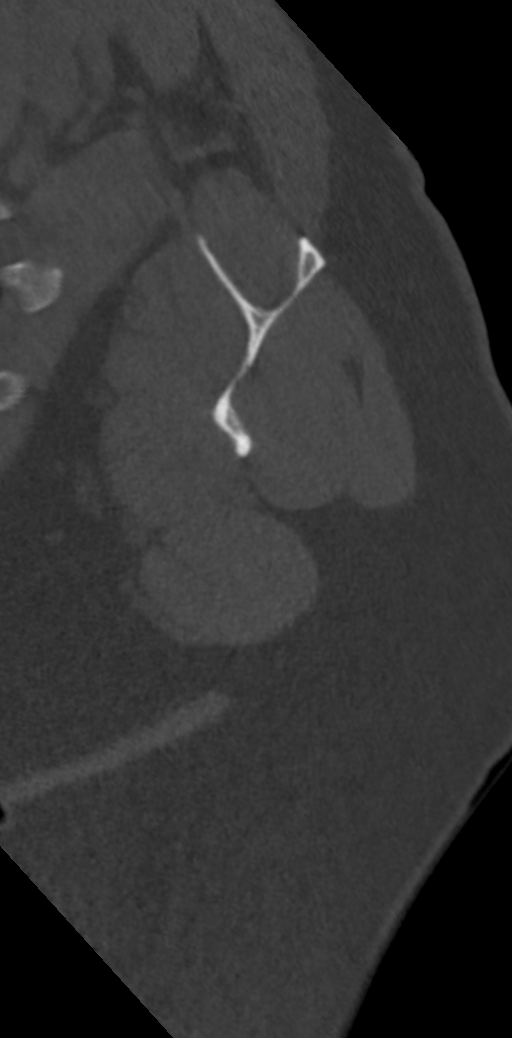
[im 84/140  bone]
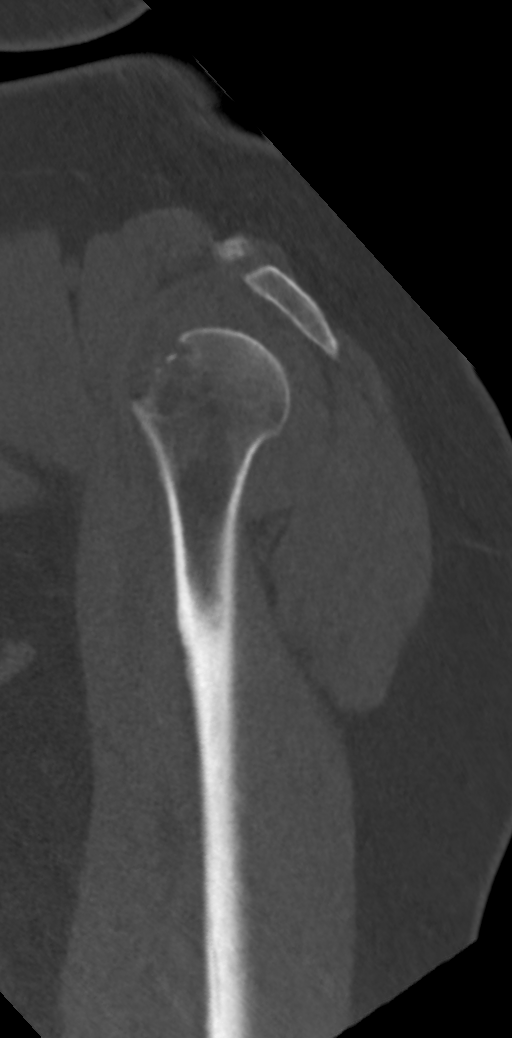
[im 112/140  bone]
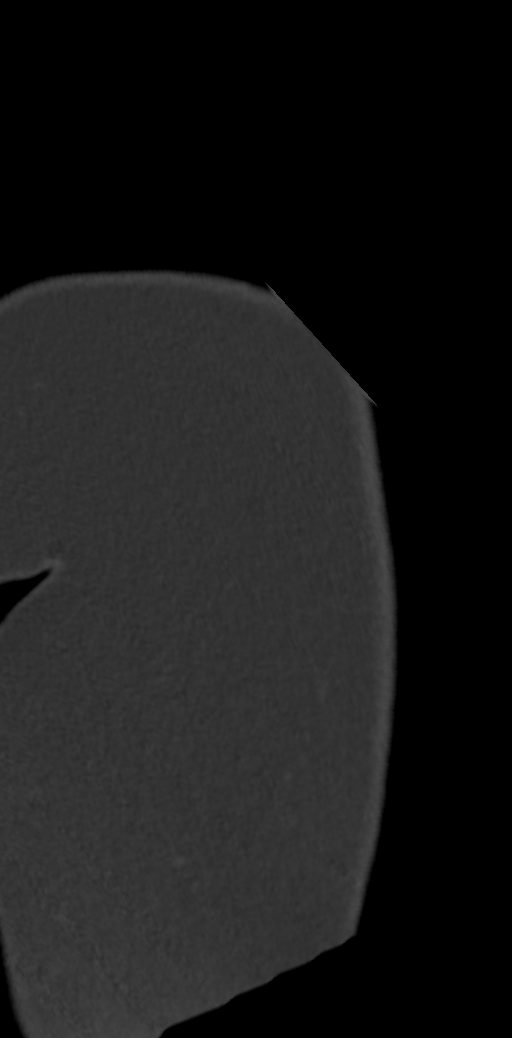

[4 of 14 positions shown; findings below may reference images not displayed]

FINDINGS: Bones/Joint/Cartilage

Acute mildly comminuted fracture involving the lesser tuberosity of
the proximal left humerus (series 4, image 42). Fracture fragment
measures 1.8 x 0.8 x 1.4 cm. The fracture fragment is internally
rotated and medially displaced by approximately 1.7 cm along the
anterior margin of the glenohumeral joint. No fracture involvement
of the greater tuberosity or humeral neck. Glenohumeral joint
alignment is maintained without dislocation. Glenohumeral joint
space is maintained. Small glenohumeral joint effusion/hemarthrosis.
Intact AC joint with mild arthropathy. The remaining visualized
osseous structures are intact.

Ligaments

Suboptimally assessed by CT.

Muscles and Tendons

Subscapularis muscle appears enlarged and edematous with increased
density likely intramuscular hemorrhage (series 9, image 66). Likely
avulsion fracture related to the subscapularis tendon. Rotator cuff
muscle bulk is normal without atrophy or fatty infiltration.

Soft tissues

No well-defined soft tissue hematoma. No axillary lymphadenopathy.
Visualized portion of the left lung is clear.
IMPRESSION: Acute mildly comminuted and displaced fracture involving the lesser
tuberosity of the proximal left humerus. Findings likely represent
avulsion fracture related to the subscapularis tendon.

## 2023-05-21 ENCOUNTER — Other Ambulatory Visit: Payer: Self-pay | Admitting: Urology

## 2023-05-26 NOTE — Patient Instructions (Addendum)
SURGICAL WAITING ROOM VISITATION Patients having surgery or a procedure may have no more than 2 support people in the waiting area - these visitors may rotate in the visitor waiting room.   Due to an increase in RSV and influenza rates and associated hospitalizations, children ages 23 and under may not visit patients in [facility] hospitals. If the patient needs to stay at the hospital during part of their recovery, the visitor guidelines for inpatient rooms apply.  PRE-OP VISITATION  Pre-op nurse will coordinate an appropriate time for 1 support person to accompany the patient in pre-op.  This support person may not rotate.  This visitor will be contacted when the time is appropriate for the visitor to come back in the pre-op area.  Please refer to the [facility] website for the visitor guidelines for Inpatients (after your surgery is over and you are in a regular room).  You are not required to quarantine at this time prior to your surgery. However, you must do this: Hand Hygiene often Do NOT share personal items Notify your provider if you are in close contact with someone who has COVID or you develop fever 100.4 or greater, new onset of sneezing, cough, sore throat, shortness of breath or body aches.  If you test positive for Covid or have been in contact with anyone that has tested positive in the last 10 days please notify you surgeon.    Your procedure is scheduled on:  TUESDAY  June 03, 2023  Report to [facility] Main Entrance: [address] entrance where the [employer] is available.   Report to admitting at:   1:15 PM  Call this number if you have any questions or problems the morning of surgery [phone]  DO NOT EAT OR DRINK ANYTHING AFTER MIDNIGHT THE NIGHT PRIOR TO YOUR SURGERY / PROCEDURE.   FOLLOW  ANY ADDITIONAL PRE OP INSTRUCTIONS YOU RECEIVED FROM YOUR SURGEON'S OFFICE!!!   Oral Hygiene is also important to reduce your risk of infection.         Remember - BRUSH YOUR TEETH THE MORNING OF SURGERY WITH YOUR REGULAR TOOTHPASTE  Do NOT smoke after Midnight the night before surgery.  STOP TAKING all Vitamins, Herbs and supplements 1 week before your surgery.    OZEMPIC- DO NOT INJECT this medicine on Saturday 05-31-23.  You last dose would have been on 05-24-2023  Take ONLY these medicines the morning of surgery with A SIP OF WATER: levothyroxine, metoprolol                   You may not have any metal on your body including hair pins, jewelry, and body piercing  Do not wear make-up, lotions, powders, perfumes or deodorant  Do not wear nail polish including gel and S&S, artificial / acrylic nails, or any other type of covering on natural nails including finger and toenails. If you have artificial nails, gel coating, etc., that needs to be removed by a nail salon, Please have this removed prior to surgery. Not doing so may mean that your surgery could be cancelled or delayed if the Surgeon or anesthesia staff feels like they are unable to monitor you safely.   Do not shave 48 hours prior to surgery to avoid nicks in your skin which may contribute to postoperative infections.    Contacts, Hearing Aids, dentures or bridgework may not be worn into surgery. DENTURES WILL BE REMOVED PRIOR TO SURGERY PLEASE DO NOT APPLY "Poly grip" OR ADHESIVES!!!  Patients discharged on the day of surgery will not be allowed to drive home.  Someone NEEDS to stay with you for the first 24 hours after anesthesia.  Do not bring your home medications to the hospital. The Pharmacy will dispense medications listed on your medication list to you during your admission in the Hospital.  Please read over the following fact sheets you were given: IF YOU HAVE QUESTIONS ABOUT YOUR PRE-OP INSTRUCTIONS, PLEASE CALL [phone].   [place] - Preparing for Surgery Before surgery, you can play an important role.  Because skin is not sterile, your skin needs to be as  free of germs as possible.  You can reduce the number of germs on your skin by washing with CHG (chlorahexidine gluconate) soap before surgery.  CHG is an antiseptic cleaner which kills germs and bonds with the skin to continue killing germs even after washing. Please DO NOT use if you have an allergy to CHG or antibacterial soaps.  If your skin becomes reddened/irritated stop using the CHG and inform your nurse when you arrive at Short Stay. Do not shave (including legs and underarms) for at least 48 hours prior to the first CHG shower.  You may shave your face/neck.  Please follow these instructions carefully:  1.  Shower with CHG Soap the night before surgery and the  morning of surgery.  2.  If you choose to wash your hair, wash your hair first as usual with your normal  shampoo.  3.  After you shampoo, rinse your hair and body thoroughly to remove the shampoo.                             4.  Use CHG as you would any other liquid soap.  You can apply chg directly to the skin and wash.  Gently with a scrungie or clean washcloth.  5.  Apply the CHG Soap to your body ONLY FROM THE NECK DOWN.   Do not use on face/ open                           Wound or open sores. Avoid contact with eyes, ears mouth and genitals (private parts).                       Wash face,  Genitals (private parts) with your normal soap.             6.  Wash thoroughly, paying special attention to the area where your  surgery  will be performed.  7.  Thoroughly rinse your body with warm water from the neck down.  8.  DO NOT shower/wash with your normal soap after using and rinsing off the CHG Soap.            9.  Pat yourself dry with a clean towel.            10.  Wear clean pajamas.            11.  Place clean sheets on your bed the night of your first shower and do not  sleep with pets.  ON THE DAY OF SURGERY : Do not apply any lotions/deodorants the morning of surgery.  Please wear clean clothes to the hospital/surgery  center.    FAILURE TO FOLLOW THESE INSTRUCTIONS MAY RESULT IN THE CANCELLATION OF YOUR SURGERY  PATIENT SIGNATURE_________________________________  NURSE SIGNATURE__________________________________  ________________________________________________________________________

## 2023-05-26 NOTE — Progress Notes (Addendum)
COVID Vaccine received:  []  No [x]  Yes Date of any COVID positive Test in last 90 days:  none  PCP - Peri Maris, FNP at [facility] Cardiologist - none  Chest x-ray -  EKG -  will do at PST Stress Test -  ECHO -  Cardiac Cath -   PCR screen: []  Ordered & Completed []   No Order but Needs PROFEND     [x]   N/A for this surgery  Surgery Plan:  [x]  Ambulatory   []  Outpatient in bed  []  Admit Anesthesia:    [x]  General  []  Spinal  []   Choice []   MAC  Bowel Prep - [x]  No  []   Yes ______  Pacemaker / ICD device [x]  No []  Yes   Spinal Cord Stimulator:[x]  No []  Yes       History of Sleep Apnea? [x]  No []  Yes   CPAP used?- [x]  No []  Yes    Does the patient monitor blood sugar?   []  N/A   [x]  No []  Yes  Patient has: []  NO Hx DM   [x]  Pre-DM   []  DM1  []   DM2 Last A1c was: 4.6 on  02-18-2023  [facility]    Does patient have a [employer] or Dexacom? [x]  No []  Yes   Fasting Blood Sugar Ranges-  Checks Blood Sugar __0 times a day  GLP1 agonist / usual dose - Ozempic  Saturdays  GLP1 instructions:Last dose: 05-24-2023 none on 05-31-2023   Blood Thinner / Instructions: none Aspirin Instructions: none  ERAS Protocol Ordered: [x]  No  []  Yes Patient is to be NPO after: midnight prior  Dental hx: []  Dentures:  [x]  N/A      []  Bridge or Partial:                   []  Loose or Damaged teeth:   Activity level: Patient is able to climb a flight of stairs without difficulty; [x]  No CP  but would have _slight SOB.  Patient can perform ADLs without assistance.   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  PONV  Patient denies shortness of breath, fever, cough and chest pain at PAT appointment.  Patient verbalized understanding and agreement to the Pre-Surgical Instructions that were given to them at this PAT appointment. Patient was also  educated of the need to review these PAT instructions again prior to her surgery.I reviewed the appropriate phone numbers to call if they have any and questions or concerns.

## 2023-05-28 ENCOUNTER — Other Ambulatory Visit: Payer: Self-pay

## 2023-05-28 ENCOUNTER — Encounter (HOSPITAL_COMMUNITY): Payer: Self-pay

## 2023-05-28 ENCOUNTER — Encounter (HOSPITAL_COMMUNITY)
Admission: RE | Admit: 2023-05-28 | Discharge: 2023-05-28 | Disposition: A | Discharge status: Home / Self Care | Payer: Self-pay | Source: Ambulatory Visit | Attending: Urology | Admitting: Urology

## 2023-05-28 VITALS — BP 116/68 | HR 66 | Temp 98.9°F | Resp 16 | Ht 63.0 in | Wt 204.0 lb

## 2023-05-28 DIAGNOSIS — I10 Essential (primary) hypertension: Secondary | ICD-10-CM | POA: Diagnosis not present

## 2023-05-28 DIAGNOSIS — R9431 Abnormal electrocardiogram [ECG] [EKG]: Secondary | ICD-10-CM | POA: Insufficient documentation

## 2023-05-28 DIAGNOSIS — Z01812 Encounter for preprocedural laboratory examination: Secondary | ICD-10-CM | POA: Diagnosis present

## 2023-05-28 DIAGNOSIS — Z01810 Encounter for preprocedural cardiovascular examination: Secondary | ICD-10-CM | POA: Diagnosis present

## 2023-05-28 DIAGNOSIS — R7303 Prediabetes: Secondary | ICD-10-CM

## 2023-05-28 DIAGNOSIS — Z01818 Encounter for other preprocedural examination: Secondary | ICD-10-CM | POA: Diagnosis not present

## 2023-05-28 HISTORY — DX: Unspecified osteoarthritis, unspecified site: M19.90

## 2023-05-28 HISTORY — DX: Pneumonia, unspecified organism: J18.9

## 2023-05-28 HISTORY — DX: Prediabetes: R73.03

## 2023-05-28 LAB — CBC
HCT: 41.8 % (ref 36.0–46.0)
Hemoglobin: 14.4 g/dL (ref 12.0–15.0)
MCH: 31.1 pg (ref 26.0–34.0)
MCHC: 34.4 g/dL (ref 30.0–36.0)
MCV: 90.3 fL (ref 80.0–100.0)
Platelets: 379 10*3/uL (ref 150–400)
RBC: 4.63 MIL/uL (ref 3.87–5.11)
RDW: 13.2 % (ref 11.5–15.5)
WBC: 5.6 10*3/uL (ref 4.0–10.5)
nRBC: 0 % (ref 0.0–0.2)

## 2023-05-28 LAB — BASIC METABOLIC PANEL
Anion gap: 7 (ref 5–15)
BUN: 13 mg/dL (ref 6–20)
CO2: 29 mmol/L (ref 22–32)
Calcium: 9.2 mg/dL (ref 8.9–10.3)
Chloride: 104 mmol/L (ref 98–111)
Creatinine, Ser: 0.65 mg/dL (ref 0.44–1.00)
GFR, Estimated: >60 mL/min (ref >60)
Glucose, Bld: 78 mg/dL (ref 70–99)
Potassium: 3.7 mmol/L (ref 3.5–5.1)
Sodium: 140 mmol/L (ref 135–145)

## 2023-06-02 NOTE — H&P (Signed)
CC/HPI: 58-year-old woman withcc: Frequent UTIs   03/19/2021: 58 year old woman with a history of chronic cystitis here to establish care. She was seen by Dr. Benancio Deeds approximately 6 months ago. She is currently on d-mannose, cranberry and vaginal estrogen cream twice a week. Patient has had urinary tract infections since 2009. Prior to that she did not have frequent UTIs. She was seen by Dr. Brunilda Payor and Dr. Earl Lites and underwent workup with both Urologists previously. She has undergone a 58-month course of suppressive low-dose antibiotics. Patient's had 1 UTI in the last 6 months. She feels like things are improving and when she stays on her current regimen things are under control. She does note times of stress she feels like she gets more UTIs. She is currently asymptomatic today.   10/22/2021: 58 year old woman with a history of frequent UTIs here for follow-up. She has not experienced any UTIs in the last 6 months however she did develop change in odor and intermittent dysuria about a week and a half ago. Her symptoms have not progressed and so she is unsure if she actually has a UTI. She has done well for the last several months but had a colonoscopy in June and is wondering if this set things off. She remains on vaginal estradiol cream and d-mannose and cranberry.   12/24/2021:  Patient presents today with acute UTI symptoms. This is her 3rd bout since July after being UTI free for over 1 year, with E.coli 7/10 and pseudomonas 7/28. Patient is distressed at the return of chronic UTIs after having staved them off for so long without prophylaxis (previously on low-dose macrodantin with Dr. Brunilda Payor). She states symptoms began 1 week ago with dysuria, increased daytime frequency and nocturia x3 or more, urgency with leakage and incontinence, and most recently bladder spasms. Patient is anxious about the frequency of recurrence though she is more relieved that the individual occurrences grow different bacteria, meaning  she is adequately clearing each infection. She inquired about return to daily prophylaxis. Patient does endorse starting Ozempic 6 weeks ago and it has been effective for her. She continues on D-mannose and estradiol vaginal cream 2-3 times a week.   03/27/2022: 58 year old woman with a history of chronic cystitis here for follow-up. She is currently on vaginal estradiol cream, d-mannose, and suppressive cephalexin 250 mg daily. She has been taking this for the last 3 months. She had a symptomatic UTI in September 2023. She has had a cystoscopy in the past but not recently. She is not symptomatic today.   06/26/2022: 58 year old woman with a history of chronic cystitis currently managed with vaginal estradiol cream, bladder probiotic with d-mannose and cranberry and suppressive cephalexin 250 mg daily. She has not had any UTI since September 2023. Her urinalysis today looks normal. She has changed diabetes medications and is currently on Ozempic. Her hemoglobin A1c is the best it ever been. She is asymptomatic today. Her first UTI symptom is typically increased nocturia from 1 time at baseline to 2-3 times. This occurs before progression to dysuria.   11/19/2022:  Patient presents acutely with concerns for dysuria. She continues on vaginal Estrace cream, probiotics. She completed her course of cephalexin 1 month ago. Unfortunately, patient developed a UTI shortly thereafter, and was treated with Cipro, which she completed last Sunday, prescribed by PCP. Review of external records without additional details. Since completing Cipro, patient states that her symptoms have returned. Today, she endorses increased frequency, urgency, dysuria, with bladder spasms. She denies suprapubic discomfort, gross hematuria,  flank pain, fever/chills, nausea/vomiting.   12/05/2022:  Patient presents for 2-week follow-up on UTI. Culture at last visit grew E. coli, with some resistant. Patient was appropriately treated on Macrobid,  and she has completed her antibiotic course. She continues on vaginal estradiol cream, supplementation with d-mannose, cranberry, probiotics. Since last seen, patient resolution of dysuria, but persistence of a "weird" sensation vaginally. She has decreased frequency, but maintains variable urgency, and nocturia x 2, base 0-1. She states she is unsure if she has cleared her UTI. Today, she denies gross hematuria, flank pain, fever/chills, nausea/vomiting.   01/01/2023: 58 year old with a history of chronic cystitis managed with vaginal estradiol cream and bladder probiotic with d-mannose and cranberry. She was seen in early August for symptoms of a UTI and treated with Macrobid. Patient completed 62-month course of cephalexin for suppressive antibiotics. She is currently taking the Augmentin that was prescribed to her in late August. Her symptoms of a UTI began with increased nocturia, a sensation when she urinates and increased urgency/urge incontinence. Today she is feeling at her baseline.   04/21/2023: 58 year old woman with a history of chronic cystitis managed with vaginal estradiol, bladder probiotic biotic with d-mannose and cranberry and suppressive cephalexin here for follow-up. She is here for cystoscopy. No breakthrough UTIs. She denies any dysuria or pelvic pain.     ALLERGIES: codeine Sulfa Drugs    MEDICATIONS: Diflucan 200 mg tablet 1 tablet PO Q72H Take 1 pill today, and another in 3 days.  Hydrochlorothiazide 25 mg tablet  Metoprolol Succinate 100 mg tablet, extended release 24 hr  Biotin  Calcium  Cephalexin 250 mg capsule 1 capsule PO Daily  D-Mannose  Estradiol 0.01 % cream with applicator  Losartan Potassium 25 mg tablet  Nasacort 55 mcg aerosol, spray  Ozempic  Probiotic  Vitamin D3     GU PSH: Catheterize For Residual - 2022 Hysterectomy Unilat SO - 2009       PSH Notes: Adenoidectomy, Hysterectomy   NON-GU PSH: Remove Adenoids - 2009 Rotator cuff surgery      GU PMH: Chronic cystitis (w/o hematuria) - 01/01/2023, - 12/05/2022, - 11/19/2022, - 06/26/2022, - 03/27/2022, - 12/24/2021, - 10/22/2021 (Stable), - 03/19/2021, Chronic cystitis, - 2014 Nocturia - 01/01/2023, - 06/26/2022 Dysuria - 10/22/2021, - 2022, - 2022 Personal Hx Urinary Tract Infections - 2022, - 2022 Acute Cystitis/UTI (Stable) - 2022, Acute cystitis without hematuria, - 2014 Urinary Tract Inf, Unspec site, Urinary tract infection - 2014      PMH Notes:  1898-04-15 00:00:00 - Note: Normal Routine History And Physical Adult   NON-GU PMH: Bacteriuria - 01/01/2023, - 03/27/2022, - 10/22/2021, - 03/19/2021 Pyuria/other UA findings - 2022 Hyperthyroidism, Hyperthyroidism - 2014 Personal history of other diseases of the circulatory system, History of hypertension - 2014 Skin Cancer, History    FAMILY HISTORY: Colon Cancer - Father Diabetes - Brother Family Health Status - Mother's Age - Father Family Health Status Number - Father Father Deceased At Age62 ___ - Father leukemia - Mother nephrolithiasis - Runs In Family   SOCIAL HISTORY: Marital Status: Married Preferred Language: English; Ethnicity: Not Hispanic Or Latino; Race: White Current Smoking Status: Patient has never smoked.   Tobacco Use Assessment Completed: Used Tobacco in last 30 days? Does not use smokeless tobacco. Has never drank.  Does not use drugs. Drinks 2 caffeinated drinks per day. Has not had a blood transfusion.     Notes: Alcohol Use, Caffeine Use, Tobacco Use, Marital History - Currently Married   REVIEW  OF SYSTEMS:    GU Review Female:   Patient denies frequent urination, hard to postpone urination, burning /pain with urination, get up at night to urinate, leakage of urine, stream starts and stops, trouble starting your stream, have to strain to urinate, and being pregnant.  Gastrointestinal (Upper):   Patient denies nausea, vomiting, and indigestion/ heartburn.  Gastrointestinal (Lower):   Patient denies  diarrhea and constipation.  Constitutional:   Patient denies weight loss, fever, fatigue, and night sweats.  Skin:   Patient denies skin rash/ lesion and itching.  Eyes:   Patient denies blurred vision and double vision.  Ears/ Nose/ Throat:   Patient denies sore throat and sinus problems.  Hematologic/Lymphatic:   Patient denies swollen glands and easy bruising.  Cardiovascular:   Patient denies leg swelling and chest pains.  Respiratory:   Patient denies cough and shortness of breath.  Endocrine:   Patient denies excessive thirst.  Musculoskeletal:   Patient denies back pain and joint pain.  Neurological:   Patient denies headaches and dizziness.  Psychologic:   Patient denies depression and anxiety.   VITAL SIGNS: None   GU PHYSICAL EXAMINATION:    Breast: Symmetrical. No tenderness, no nipple discharge, no skin changes. No mass.  External Genitalia: No hirsutism, no rash, no scarring, no cyst, no erythematous lesion, no papular lesion, no blanched lesion, no warty lesion. No edema.  Urethral Meatus: Normal size. Normal position. No discharge.  Urethra: No tenderness, no mass, no scarring. No hypermobility. No leakage.  Cervix: S/P Hysterectomy  Uterus: S/P Hysterectomy   MULTI-SYSTEM PHYSICAL EXAMINATION:    Constitutional: Well-nourished. No physical deformities. Normally developed. Good grooming.  Neck: Neck symmetrical, not swollen. Normal tracheal position.  Respiratory: No labored breathing, no use of accessory muscles.   Skin: No paleness, no jaundice, no cyanosis. No lesion, no ulcer, no rash.  Neurologic / Psychiatric: Oriented to time, oriented to place, oriented to person. No depression, no anxiety, no agitation.  Eyes: Normal conjunctivae. Normal eyelids.  Ears, Nose, Mouth, and Throat: Left ear no scars, no lesions, no masses. Right ear no scars, no lesions, no masses. Nose no scars, no lesions, no masses. Normal hearing. Normal lips.  Musculoskeletal: Normal gait and  station of head and neck.     Complexity of Data:  Records Review:   Previous Patient Records, POC Tool  Urine Test Review:   Urinalysis   PROCEDURES:         Flexible Cystoscopy - 52000  Risks, benefits, and some of the potential complications of the procedure were discussed at length with the patient including infection, bleeding, voiding discomfort, urinary retention, fever, chills, sepsis, and others. All questions were answered. Informed consent was obtained. Antibiotic prophylaxis was given. Sterile technique and intraurethral analgesia were used.  Meatus:  Normal size. Normal location. Normal condition.  Urethra:  No hypermobility. No leakage.  Ureteral Orifices:  Normal location. Normal size. Normal shape. Effluxed clear urine.  Bladder:  No trabeculation. No tumors. Patchy erythema in the inferior bladder wall surrounding trigone. White plaque consistent with squamous metaplasia on trigone. No stones.      The lower urinary tract was carefully examined. The procedure was well-tolerated and without complications. Antibiotic instructions were given. Instructions were given to call the office immediately for bloody urine, difficulty urinating, urinary retention, painful or frequent urination, fever, chills, nausea, vomiting or other illness. The patient stated that she understood these instructions and would comply with them.  Visit Complexity - G2211          Urinalysis w/Scope Dipstick Dipstick Cont'd Micro  Color: Yellow Bilirubin: Neg mg/dL WBC/hpf: >14/NWG  Appearance: Slightly Cloudy Ketones: Neg mg/dL RBC/hpf: 0 - 2/hpf  Specific Gravity: 1.030 Blood: Neg ery/uL Bacteria: Mod (26-50/hpf)  pH: 5.5 Protein: 1+ mg/dL Cystals: NS (Not Seen)  Glucose: Neg mg/dL Urobilinogen: 0.2 mg/dL Casts: NS (Not Seen)    Nitrites: Neg Trichomonas: Not Present    Leukocyte Esterase: 1+ leu/uL Mucous: Present      Epithelial Cells: 0 - 5/hpf      Yeast: NS (Not Seen)      Sperm: Not  Present    ASSESSMENT:      ICD-10 Details  1 GU:   Chronic cystitis (w/o hematuria) - N30.20 Chronic, Stable  3   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 Chronic cystitis:  -Patient to continue vaginal estradiol, d-mannose and cranberry probiotic, and daily cephalexin   2. Bladder lesion seen on cystoscopy:  Discussed with patient proceeding with cystoscopy with bladder biopsy and fulguration. Risks and benefits of the procedure were discussed with the patient in detail including but not limited to pain, bleeding, infection, damage to trunk structures, need for additional treatment, bladder perforation, need for indwelling Foley catheter. Patient is agreeable with plan will be scheduled for next available surgery date

## 2023-06-03 ENCOUNTER — Ambulatory Visit (HOSPITAL_COMMUNITY)
Admission: RE | Admit: 2023-06-03 | Discharge: 2023-06-03 | Disposition: A | Discharge status: Home / Self Care | Payer: 59 | Attending: Urology | Admitting: Urology

## 2023-06-03 ENCOUNTER — Ambulatory Visit (HOSPITAL_COMMUNITY): Payer: 59 | Admitting: Physician Assistant

## 2023-06-03 ENCOUNTER — Encounter (HOSPITAL_COMMUNITY): Payer: Self-pay | Admitting: Urology

## 2023-06-03 ENCOUNTER — Other Ambulatory Visit: Payer: Self-pay

## 2023-06-03 ENCOUNTER — Encounter (HOSPITAL_COMMUNITY)
Admission: RE | Disposition: A | Discharge status: Home / Self Care | Payer: Self-pay | Source: Home / Self Care | Attending: Urology

## 2023-06-03 ENCOUNTER — Ambulatory Visit (HOSPITAL_BASED_OUTPATIENT_CLINIC_OR_DEPARTMENT_OTHER): Payer: 59 | Admitting: Anesthesiology

## 2023-06-03 DIAGNOSIS — N329 Bladder disorder, unspecified: Secondary | ICD-10-CM | POA: Diagnosis not present

## 2023-06-03 DIAGNOSIS — Z79899 Other long term (current) drug therapy: Secondary | ICD-10-CM | POA: Insufficient documentation

## 2023-06-03 DIAGNOSIS — I10 Essential (primary) hypertension: Secondary | ICD-10-CM | POA: Diagnosis not present

## 2023-06-03 DIAGNOSIS — N3020 Other chronic cystitis without hematuria: Secondary | ICD-10-CM | POA: Insufficient documentation

## 2023-06-03 DIAGNOSIS — E119 Type 2 diabetes mellitus without complications: Secondary | ICD-10-CM | POA: Diagnosis not present

## 2023-06-03 DIAGNOSIS — Z7985 Long-term (current) use of injectable non-insulin antidiabetic drugs: Secondary | ICD-10-CM | POA: Diagnosis not present

## 2023-06-03 HISTORY — PX: CYSTOSCOPY WITH FULGERATION: SHX6638

## 2023-06-03 LAB — GLUCOSE, CAPILLARY: Glucose-Capillary: 84 mg/dL (ref 70–99)

## 2023-06-03 SURGERY — CYSTOSCOPY, WITH BLADDER FULGURATION
Anesthesia: General | Site: Bladder

## 2023-06-03 MED ORDER — TRAMADOL HCL 50 MG PO TABS: 50.0000 mg | ORAL_TABLET | Freq: Four times a day (QID) | ORAL | 0 refills | Status: AC | PRN

## 2023-06-03 MED ORDER — OXYCODONE HCL 5 MG PO TABS: 5.0000 mg | ORAL_TABLET | Freq: Once | ORAL | Status: DC | PRN

## 2023-06-03 MED ORDER — ONDANSETRON HCL 4 MG/2ML IJ SOLN
INTRAMUSCULAR | Status: DC | PRN
  Administered 2023-06-03: 4 mg via INTRAVENOUS

## 2023-06-03 MED ORDER — MIDAZOLAM HCL 2 MG/2ML IJ SOLN: 0.5000 mg | Freq: Once | INTRAMUSCULAR | Status: DC | PRN

## 2023-06-03 MED ORDER — HYOSCYAMINE SULFATE 0.125 MG PO TBDP: 0.1250 mg | ORAL_TABLET | Freq: Four times a day (QID) | ORAL | 0 refills | Status: AC | PRN

## 2023-06-03 MED ORDER — FENTANYL CITRATE PF 50 MCG/ML IJ SOSY: 25.0000 ug | PREFILLED_SYRINGE | INTRAMUSCULAR | Status: DC | PRN

## 2023-06-03 MED ORDER — MIDAZOLAM HCL 2 MG/2ML IJ SOLN
INTRAMUSCULAR | Status: DC | PRN
  Administered 2023-06-03: 2 mg via INTRAVENOUS

## 2023-06-03 MED ORDER — SCOPOLAMINE 1 MG/3DAYS TD PT72
1.0000 | MEDICATED_PATCH | TRANSDERMAL | Status: DC
  Administered 2023-06-03: 1.5 mg via TRANSDERMAL
  Filled 2023-06-03: qty 1

## 2023-06-03 MED ORDER — ONDANSETRON HCL 4 MG/2ML IJ SOLN
INTRAMUSCULAR | Status: AC
  Filled 2023-06-03: qty 2

## 2023-06-03 MED ORDER — ACETAMINOPHEN 500 MG PO TABS
1000.0000 mg | ORAL_TABLET | Freq: Once | ORAL | Status: AC
  Administered 2023-06-03: 1000 mg via ORAL
  Filled 2023-06-03: qty 2

## 2023-06-03 MED ORDER — CHLORHEXIDINE GLUCONATE 0.12 % MT SOLN
15.0000 mL | Freq: Once | OROMUCOSAL | Status: AC
  Administered 2023-06-03: 15 mL via OROMUCOSAL

## 2023-06-03 MED ORDER — LIDOCAINE 2% (20 MG/ML) 5 ML SYRINGE
INTRAMUSCULAR | Status: DC | PRN
Start: 2023-06-03 — End: 2023-06-03
  Administered 2023-06-03: 40 mg via INTRAVENOUS

## 2023-06-03 MED ORDER — FENTANYL CITRATE (PF) 100 MCG/2ML IJ SOLN
INTRAMUSCULAR | Status: DC | PRN
  Administered 2023-06-03: 25 ug via INTRAVENOUS

## 2023-06-03 MED ORDER — DEXAMETHASONE SODIUM PHOSPHATE 10 MG/ML IJ SOLN
INTRAMUSCULAR | Status: DC | PRN
  Administered 2023-06-03: 4 mg via INTRAVENOUS

## 2023-06-03 MED ORDER — PROPOFOL 10 MG/ML IV BOLUS
INTRAVENOUS | Status: DC | PRN
  Administered 2023-06-03: 200 mg via INTRAVENOUS

## 2023-06-03 MED ORDER — LACTATED RINGERS IV SOLN: INTRAVENOUS | Status: DC

## 2023-06-03 MED ORDER — SODIUM CHLORIDE 0.9 % IR SOLN
Status: DC | PRN
  Administered 2023-06-03: 3000 mL

## 2023-06-03 MED ORDER — FENTANYL CITRATE (PF) 100 MCG/2ML IJ SOLN
INTRAMUSCULAR | Status: AC
  Filled 2023-06-03: qty 2

## 2023-06-03 MED ORDER — DEXAMETHASONE SODIUM PHOSPHATE 10 MG/ML IJ SOLN
INTRAMUSCULAR | Status: AC
  Filled 2023-06-03: qty 1

## 2023-06-03 MED ORDER — PHENAZOPYRIDINE HCL 200 MG PO TABS: 200.0000 mg | ORAL_TABLET | Freq: Three times a day (TID) | ORAL | 0 refills | Status: AC | PRN

## 2023-06-03 MED ORDER — ORAL CARE MOUTH RINSE: 15.0000 mL | Freq: Once | OROMUCOSAL | Status: AC

## 2023-06-03 MED ORDER — MIDAZOLAM HCL 2 MG/2ML IJ SOLN
INTRAMUSCULAR | Status: AC
  Filled 2023-06-03: qty 2

## 2023-06-03 MED ORDER — OXYCODONE HCL 5 MG/5ML PO SOLN: 5.0000 mg | Freq: Once | ORAL | Status: DC | PRN

## 2023-06-03 MED ORDER — CEFAZOLIN SODIUM-DEXTROSE 2-4 GM/100ML-% IV SOLN
2.0000 g | INTRAVENOUS | Status: AC
  Administered 2023-06-03: 2 g via INTRAVENOUS
  Filled 2023-06-03: qty 100

## 2023-06-03 MED ORDER — MEPERIDINE HCL 50 MG/ML IJ SOLN: 6.2500 mg | INTRAMUSCULAR | Status: DC | PRN

## 2023-06-03 SURGICAL SUPPLY — 13 items
BAG URINE DRAIN 2000ML AR STRL (UROLOGICAL SUPPLIES) IMPLANT
BAG URO CATCHER STRL LF (MISCELLANEOUS) ×1 IMPLANT
CLOTH BEACON ORANGE TIMEOUT ST (SAFETY) ×1 IMPLANT
ELECT COAG BIPOLAR CYL 1.2MMM (ELECTROSURGICAL)
ELECT REM PT RETURN 15FT ADLT (MISCELLANEOUS) ×1 IMPLANT
ELECTRODE COAG BIPLR CYL 1.2MM (ELECTROSURGICAL) IMPLANT
GLOVE BIO SURGEON STRL SZ 6.5 (GLOVE) ×1 IMPLANT
GOWN STRL REUS W/ TWL LRG LVL3 (GOWN DISPOSABLE) ×1 IMPLANT
KIT TURNOVER KIT A (KITS) IMPLANT
LOOP CUT BIPOLAR 24F LRG (ELECTROSURGICAL) IMPLANT
MANIFOLD NEPTUNE II (INSTRUMENTS) ×1 IMPLANT
PACK CYSTO (CUSTOM PROCEDURE TRAY) ×1 IMPLANT
TUBING CONNECTING 10 (TUBING) ×1 IMPLANT

## 2023-06-03 NOTE — Op Note (Signed)
Operative Note  Preoperative diagnosis:  1.  Bladder lesion  Postoperative diagnosis: 1.  Bladder lesion  Procedure(s): 1.  Cystoscopy with bladder biopsy and fulguration  Surgeon: Kasandra Knudsen, MD  Assistants:  None  Anesthesia:  General  Complications:  None  EBL:  minimal   Specimens: 1. Posterior wall bladder biopsy  Drains/Catheters: 1.  None  Intraoperative findings:   Normal urethra Bilateral orthotopic Uos Mild erythema posterior inferior wall  Indication:  Megan Brooks is a 58 y.o. female with erythematous area seen on cystoscopy.  Description of procedure:  After risks and benefits of the procedure were discussed with the patient, informed consent was obtained.  The patient is taken the operating placed in supine position.  Anesthesia was induced and antibiotics were administered.  She was then repositioned in the dorsolithotomy position.  She was prepped and draped in the usual sterile fashion time was performed.  A 21 French rigid cystoscope was placed near the meatus and advanced into the bladder and direct visualization.  The previously identified area of erythema was confirmed in the posterior wall.  It did appear more subtle at this time.  A cold cup bladder biopsy was used to take a biopsy from the posterior inferior wall.  The areas then fulgurated with the rollerball bipolar electrocautery through the resectoscope.  Hemostasis was adequate with the irrigant turned off.  No bladder perforation was noted.  No other abnormal lesions were seen in bladder mucosa.  The patient's bladder was decompressed and the resectoscope was removed.  The patient emerged from anesthesia and transferred to PACU in stable condition.  Plan:  Discharge home

## 2023-06-03 NOTE — Interval H&P Note (Signed)
History and Physical Interval Note:  06/03/2023 3:21 PM  Megan Brooks  has presented today for surgery, with the diagnosis of BLADDER LESION.  The various methods of treatment have been discussed with the patient and family. After consideration of risks, benefits and other options for treatment, the patient has consented to  Procedure(s) with comments: CYSTOSCOPY, BIOPSY, FULGURATION (N/A) - 30 MINUTES NEEDED as a surgical intervention.  The patient's history has been reviewed, patient examined, no change in status, stable for surgery.  I have reviewed the patient's chart and labs.  Questions were answered to the patient's satisfaction.     [name] D [employer]

## 2023-06-03 NOTE — Anesthesia Procedure Notes (Signed)
Procedure Name: LMA Insertion Date/Time: 06/03/2023 4:02 PM  Performed by: Nelle Don, CRNAPre-anesthesia Checklist: Patient identified, Emergency Drugs available, Suction available and Patient being monitored Patient Re-evaluated:Patient Re-evaluated prior to induction Oxygen Delivery Method: Circle system utilized Preoxygenation: Pre-oxygenation with 100% oxygen Induction Type: IV induction Ventilation: Mask ventilation without difficulty LMA: LMA inserted LMA Size: 4.0 Number of attempts: 1 Dental Injury: Teeth and Oropharynx as per pre-operative assessment

## 2023-06-03 NOTE — Anesthesia Preprocedure Evaluation (Addendum)
Anesthesia Evaluation  Patient identified by MRN, date of birth, ID band Patient awake    Reviewed: Allergy & Precautions, NPO status , Patient's Chart, lab work & pertinent test results, reviewed documented beta blocker date and time   History of Anesthesia Complications (+) PONV  Airway Mallampati: II  TM Distance: >3 FB Neck ROM: Full    Dental  (+) Dental Advisory Given   Pulmonary neg pulmonary ROS   breath sounds clear to auscultation       Cardiovascular hypertension, Pt. on medications and Pt. on home beta blockers (-) angina  Rhythm:Regular Rate:Normal     Neuro/Psych   Anxiety     negative neurological ROS     GI/Hepatic negative GI ROS, Neg liver ROS,,,  Endo/Other  Hypothyroidism  Ozempic BMI 36  Renal/GU negative Renal ROS     Musculoskeletal   Abdominal   Peds  Hematology negative hematology ROS (+)   Anesthesia Other Findings   Reproductive/Obstetrics                             Anesthesia Physical Anesthesia Plan  ASA: 2  Anesthesia Plan: General   Post-op Pain Management: Tylenol PO (pre-op)*   Induction: Intravenous  PONV Risk Score and Plan: 4 or greater and Ondansetron, Dexamethasone and Scopolamine patch - Pre-op  Airway Management Planned: LMA  Additional Equipment: None  Intra-op Plan:   Post-operative Plan:   Informed Consent: I have reviewed the patients History and Physical, chart, labs and discussed the procedure including the risks, benefits and alternatives for the proposed anesthesia with the patient or authorized representative who has indicated his/her understanding and acceptance.     Dental advisory given  Plan Discussed with: CRNA and Surgeon  Anesthesia Plan Comments:         Anesthesia Quick Evaluation

## 2023-06-03 NOTE — Transfer of Care (Signed)
Immediate Anesthesia Transfer of Care Note  Patient: Megan Brooks  Procedure(s) Performed: CYSTOSCOPY, BIOPSY, FULGURATION (Bladder)  Patient Location: PACU  Anesthesia Type:General  Level of Consciousness: awake, alert , and oriented  Airway & Oxygen Therapy: Patient Spontanous Breathing and Patient connected to face mask oxygen  Post-op Assessment: Report given to RN, Post -op Vital signs reviewed and stable, and Patient moving all extremities X 4  Post vital signs: Reviewed and stable  Last Vitals:  Vitals Value Taken Time  BP 144/77   Temp    Pulse 69 06/03/23 1626  Resp 12 06/03/23 1626  SpO2 100 % 06/03/23 1626  Vitals shown include unfiled device data.  Last Pain:  Vitals:   06/03/23 1448  TempSrc:   PainSc: 0-No pain      Patients Stated Pain Goal: 4 (06/03/23 1410)  Complications: No notable events documented.

## 2023-06-03 NOTE — Discharge Instructions (Addendum)
Post Bladder Surgery Instructions   General instructions:     Your recent bladder surgery requires very little post hospital care but some definite precautions.  Despite the fact that no skin incisions were used, the area around the bladder incisions are raw and covered with scabs to promote healing and prevent bleeding. Certain precautions are needed to insure that the scabs are not disturbed over the next 2-4 weeks while the healing proceeds.  Because the raw surface inside your bladder and the irritating effects of urine you may expect frequency of urination and/or urgency (a stronger desire to urinate) and perhaps even getting up at night more often. This will usually resolve or improve slowly over the healing period. You may see some blood in your urine over the first 6 weeks. Do not be alarmed, even if the urine was clear for a while. Get off your feet and drink lots of fluids until clearing occurs. If you start to pass clots or don't improve call us.  Catheter: (If you are discharged with a catheter.)  1. Keep your catheter secured to your leg at all times with tape or the supplied strap. 2. You may experience leakage of urine around your catheter- as long as the  catheter continues to drain, this is normal.  If your catheter stops draining  go to the ER. 3. You may also have blood in your urine, even after it has been clear for  several days; you may even pass some small blood clots or other material.  This  is normal as well.  If this happens, sit down and drink plenty of water to help  make urine to flush out your bladder.  If the blood in your urine becomes worse  after doing this, contact our office or return to the ER. 4. You may use the leg bag (small bag) during the day, but use the large bag at  night.  Diet:  You may return to your normal diet immediately. Because of the raw surface of your bladder, alcohol, spicy foods, foods high in acid and drinks with caffeine may  cause irritation or frequency and should be used in moderation. To keep your urine flowing freely and avoid constipation, drink plenty of fluids during the day (8-10 glasses). Tip: Avoid cranberry juice because it is very acidic.  Activity:  Your physical activity doesn't need to be restricted. However, if you are very active, you may see some blood in the urine. We suggest that you reduce your activity under the circumstances until the bleeding has stopped.  Bowels:  It is important to keep your bowels regular during the postoperative period. Straining with bowel movements can cause bleeding. A bowel movement every other day is reasonable. Use a mild laxative if needed, such as milk of magnesia 2-3 tablespoons, or 2 Dulcolax tablets. Call if you continue to have problems. If you had been taking narcotics for pain, before, during or after your surgery, you may be constipated. Take a laxative if necessary.    Medication:  You should resume your pre-surgery medications unless told not to. In addition you may be given an antibiotic to prevent or treat infection. Antibiotics are not always necessary. All medication should be taken as prescribed until the bottles are finished unless you are having an unusual reaction to one of the drugs.  Hyoscyamine - can help with bladder spasms Pyridium - (like AZO) can help with burning with urination, will turn urine orange Tramadol - pain medication (do  not drive) Acetaminophen (Tylenol) - can be used as well. Do not exceed 3000mg  in 24 hour period.

## 2023-06-03 NOTE — Anesthesia Postprocedure Evaluation (Signed)
Anesthesia Post Note  Patient: Megan Brooks  Procedure(s) Performed: CYSTOSCOPY, BIOPSY, FULGURATION (Bladder)     Patient location during evaluation: PACU Anesthesia Type: General Level of consciousness: awake and alert, patient cooperative and oriented Pain management: pain level controlled Vital Signs Assessment: post-procedure vital signs reviewed and stable Respiratory status: spontaneous breathing, nonlabored ventilation and respiratory function stable Cardiovascular status: blood pressure returned to baseline and stable Postop Assessment: no apparent nausea or vomiting and adequate PO intake Anesthetic complications: no   No notable events documented.  Last Vitals:  Vitals:   06/03/23 1630 06/03/23 1645  BP: 134/82 (!) 141/94  Pulse: 72 67  Resp: 10 10  Temp:    SpO2: 100% 100%    Last Pain:  Vitals:   06/03/23 1645  TempSrc:   PainSc: 0-No pain                 Nahiem Dredge,E. Darlis Wragg

## 2023-06-04 ENCOUNTER — Encounter (HOSPITAL_COMMUNITY): Payer: Self-pay | Admitting: Urology

## 2023-06-05 LAB — SURGICAL PATHOLOGY
